# Patient Record
Sex: Female | Born: 1980 | Race: White | Hispanic: No | Marital: Married | Smoking: Current every day smoker
Health system: Southern US, Community
[De-identification: ages and names within clinical notes are randomized; demographics above are authoritative.]

## PROBLEM LIST (undated history)

## (undated) DIAGNOSIS — O139 Gestational [pregnancy-induced] hypertension without significant proteinuria, unspecified trimester: Secondary | ICD-10-CM

## (undated) DIAGNOSIS — K219 Gastro-esophageal reflux disease without esophagitis: Secondary | ICD-10-CM

## (undated) DIAGNOSIS — E669 Obesity, unspecified: Secondary | ICD-10-CM

## (undated) DIAGNOSIS — J189 Pneumonia, unspecified organism: Secondary | ICD-10-CM

## (undated) DIAGNOSIS — F329 Major depressive disorder, single episode, unspecified: Secondary | ICD-10-CM

## (undated) DIAGNOSIS — F32A Depression, unspecified: Secondary | ICD-10-CM

## (undated) DIAGNOSIS — I2699 Other pulmonary embolism without acute cor pulmonale: Secondary | ICD-10-CM

---
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
## 2003-01-02 ENCOUNTER — Inpatient Hospital Stay (HOSPITAL_COMMUNITY): Admission: AD | Admit: 2003-01-02 | Discharge: 2003-01-02 | Payer: Self-pay | Admitting: Obstetrics & Gynecology

## 2003-01-02 ENCOUNTER — Encounter: Payer: Self-pay | Admitting: Obstetrics & Gynecology

## 2003-01-31 ENCOUNTER — Ambulatory Visit (HOSPITAL_COMMUNITY): Admission: RE | Admit: 2003-01-31 | Discharge: 2003-01-31 | Payer: Self-pay | Admitting: Obstetrics & Gynecology

## 2003-01-31 ENCOUNTER — Encounter: Payer: Self-pay | Admitting: Obstetrics & Gynecology

## 2003-06-18 ENCOUNTER — Inpatient Hospital Stay (HOSPITAL_COMMUNITY): Admission: AD | Admit: 2003-06-18 | Discharge: 2003-06-21 | Payer: Self-pay | Admitting: Obstetrics

## 2003-06-19 ENCOUNTER — Encounter (INDEPENDENT_AMBULATORY_CARE_PROVIDER_SITE_OTHER): Payer: Self-pay | Admitting: Specialist

## 2009-08-17 ENCOUNTER — Emergency Department (HOSPITAL_COMMUNITY): Admission: EM | Admit: 2009-08-17 | Discharge: 2009-08-17 | Payer: Self-pay | Admitting: Emergency Medicine

## 2009-08-18 ENCOUNTER — Ambulatory Visit (HOSPITAL_COMMUNITY): Admission: RE | Admit: 2009-08-18 | Discharge: 2009-08-18 | Payer: Self-pay | Admitting: Emergency Medicine

## 2010-08-23 LAB — COMPREHENSIVE METABOLIC PANEL
ALT: 12 U/L (ref 0–35)
Alkaline Phosphatase: 65 U/L (ref 39–117)
CO2: 24 mEq/L (ref 19–32)
GFR calc non Af Amer: >60 mL/min (ref >60)
Glucose, Bld: 85 mg/dL (ref 70–99)
Potassium: 3.8 mEq/L (ref 3.5–5.1)
Sodium: 138 mEq/L (ref 135–145)

## 2010-08-23 LAB — POCT PREGNANCY, URINE: Preg Test, Ur: NEGATIVE

## 2010-08-23 LAB — URINALYSIS, ROUTINE W REFLEX MICROSCOPIC
Bilirubin Urine: NEGATIVE
Glucose, UA: NEGATIVE mg/dL
Hgb urine dipstick: NEGATIVE
Ketones, ur: NEGATIVE mg/dL
Nitrite: NEGATIVE
Protein, ur: NEGATIVE mg/dL
Specific Gravity, Urine: 1.029 (ref 1.005–1.030)
Urobilinogen, UA: 0.2 mg/dL (ref 0.0–1.0)
pH: 5 (ref 5.0–8.0)

## 2010-08-23 LAB — DIFFERENTIAL
Basophils Relative: 1 % (ref 0–1)
Eosinophils Absolute: 0.2 10*3/uL (ref 0.0–0.7)
Monocytes Relative: 4 % (ref 3–12)
Neutrophils Relative %: 73 % (ref 43–77)

## 2010-08-23 LAB — CBC
Hemoglobin: 13.3 g/dL (ref 12.0–15.0)
RBC: 4.47 MIL/uL (ref 3.87–5.11)

## 2010-08-23 LAB — URINE MICROSCOPIC-ADD ON

## 2010-10-15 NOTE — H&P (Signed)
NAME:  Samantha Bennett, Samantha Bennett                          ACCOUNT NO.:  [id number]   MEDICAL RECORD NO.:  [id number]                   PATIENT TYPE:  MAT   LOCATION:  [facility]                                 FACILITY:  [facility]   PHYSICIAN:  Roseanna Rainbow, M.D.         DATE OF BIRTH:  December 10, 1980   DATE OF ADMISSION:  06/18/2003  DATE OF DISCHARGE:                                HISTORY & PHYSICAL   CHIEF COMPLAINT:  The patient is a 30 year old gravida 5, para 0 with  estimated date of confinement June 18, 2003 confirmed by a 15-week  ultrasound with an intrauterine pregnancy at 40 weeks with elevated blood  pressures in the office.   HISTORY OF PRESENT ILLNESS:  As above.  The patient denies any complaints  consistent with severe preeclampsia.   PREGNANCY PROBLEMS AND RISKS:  History of bipolar disorder, obesity, and  tobacco use.   LABORATORY WORK:  Hemoglobin and hematocrit 13 and 37.4, platelets 204,000,  blood type A positive, Rh antibody negative, sickle cell trait negative, RPR  nonreactive, rubella immune, hepatitis B surface antigen negative, HIV  nonreactive, GBS negative on May 23, 2003, quad screen within normal  limits, 1-hour GCT 123, repeat RPR nonreactive, most recent ultrasound on  June 04, 2003 estimated fetal weight percentile 72nd percentile, normal  amniotic fluid index.   PAST OBSTETRICAL-GYNECOLOGICAL HISTORY:  She is status post four termination  of pregnancies, three were medical and one was a suction D&C.   PAST MEDICAL HISTORY:  See above.  Also remarkable for GERD.   FAMILY HISTORY:  Remarkable for COPD.   PAST SURGICAL HISTORY:  She denies.   SOCIAL HISTORY:  Single, employed as a Advertising account planner, she has history of  tobacco use, she denies any ethanol or substance abuse.   ALLERGIES:  No known drug allergies.   MEDICATIONS:  Prenatal vitamins.   PHYSICAL EXAMINATION:  VITAL SIGNS:  Temperature 97.6, pulse 88, blood  pressure 144/83.  Urine  remarkable for trace protein.  GENERAL:  Overweight; no apparent distress.  ABDOMEN:  Gravid, nontender, fetal heart tones 140s, cephalic presentation  by Leopold's.  STERILE VAGINAL EXAM:  Cervix is loose, 1, 50%, with the vertex at a -3  station.  The membranes are stripped.   ASSESSMENT:  Primigravida at term with likely mild pregnancy-induced  hypertension, gestational hypertension, borderline Fisher score.   PLAN:  Admission for induction of labor, we will begin with cervical  ripening with Cytotec, we will also obtain a preeclampsia lab profile.                                               Roseanna Rainbow, M.D.    Judee Clara  D:  06/18/2003  T:  06/18/2003  Job:  [id number]

## 2014-05-30 HISTORY — PX: WISDOM TOOTH EXTRACTION: SHX21

## 2015-05-05 ENCOUNTER — Emergency Department (HOSPITAL_BASED_OUTPATIENT_CLINIC_OR_DEPARTMENT_OTHER)
Admission: EM | Admit: 2015-05-05 | Discharge: 2015-05-06 | Disposition: A | Discharge status: Home / Self Care | Payer: Medicaid Other | Attending: Emergency Medicine | Admitting: Emergency Medicine

## 2015-05-05 ENCOUNTER — Encounter (HOSPITAL_BASED_OUTPATIENT_CLINIC_OR_DEPARTMENT_OTHER): Payer: Self-pay | Admitting: *Deleted

## 2015-05-05 ENCOUNTER — Emergency Department (HOSPITAL_BASED_OUTPATIENT_CLINIC_OR_DEPARTMENT_OTHER): Payer: Medicaid Other

## 2015-05-05 DIAGNOSIS — Y998 Other external cause status: Secondary | ICD-10-CM | POA: Diagnosis not present

## 2015-05-05 DIAGNOSIS — Y9371 Activity, boxing: Secondary | ICD-10-CM | POA: Insufficient documentation

## 2015-05-05 DIAGNOSIS — W500XXA Accidental hit or strike by another person, initial encounter: Secondary | ICD-10-CM | POA: Diagnosis not present

## 2015-05-05 DIAGNOSIS — F17210 Nicotine dependence, cigarettes, uncomplicated: Secondary | ICD-10-CM | POA: Insufficient documentation

## 2015-05-05 DIAGNOSIS — S62357A Nondisplaced fracture of shaft of fifth metacarpal bone, left hand, initial encounter for closed fracture: Secondary | ICD-10-CM | POA: Insufficient documentation

## 2015-05-05 DIAGNOSIS — Y92838 Other recreation area as the place of occurrence of the external cause: Secondary | ICD-10-CM | POA: Insufficient documentation

## 2015-05-05 DIAGNOSIS — S62318A Displaced fracture of base of other metacarpal bone, initial encounter for closed fracture: Secondary | ICD-10-CM

## 2015-05-05 DIAGNOSIS — S6992XA Unspecified injury of left wrist, hand and finger(s), initial encounter: Secondary | ICD-10-CM | POA: Diagnosis present

## 2015-05-05 MED ORDER — HYDROCODONE-ACETAMINOPHEN 5-325 MG PO TABS
1.0000 | ORAL_TABLET | Freq: Once | ORAL | Status: AC
  Administered 2015-05-05: 1 via ORAL
  Filled 2015-05-05: qty 1

## 2015-05-05 MED ORDER — HYDROCODONE-ACETAMINOPHEN 5-325 MG PO TABS: 1.0000 | ORAL_TABLET | Freq: Four times a day (QID) | ORAL | Status: DC | PRN

## 2015-05-05 NOTE — ED Provider Notes (Signed)
CSN: [id number]     Arrival date & time 05/05/15  2049 History  By signing my name below, I, Soijett Blue, attest that this documentation has been prepared under the direction and in the presence of Paula Libra, MD. Electronically Signed: Soijett Blue, ED Scribe. 05/05/2015. 10:57 PM.   Chief Complaint  Patient presents with  . Hand Injury      The history is provided by the patient. No language interpreter was used.    Samantha Bennett is a 34 y.o. female who presents to the Emergency Department complaining of left hand injury onset 1 week ago tomorrow. She notes that she was in boxing class sparring when she hit another person on the top of their head causing immediate pain in her left hand. She reports that her left hand pain has been constant and has not been getting better. It is moderate to severe at times, worse with movement or palpation. There is associated bruising and mild swelling but no deformity, numbness or functional deficit. She has not tried any medications for the relief of her symptoms. She denies other injury.  History reviewed. No pertinent past medical history. History reviewed. No pertinent past surgical history. No family history on file. Social History  Substance Use Topics  . Smoking status: Current Every Day Smoker -- 0.50 packs/day    Types: Cigarettes  . Smokeless tobacco: None  . Alcohol Use: No   OB History    No data available     Review of Systems  A complete 10 system review of systems was obtained and all systems are negative except as noted in the HPI and PMH.   Allergies  Review of patient's allergies indicates no known allergies.  Home Medications   Prior to Admission medications   Medication Sig Start Date End Date Taking? Authorizing Provider  HYDROcodone-acetaminophen (NORCO) 5-325 MG tablet Take 1-2 tablets by mouth every 6 (six) hours as needed (for pain). 05/05/15   Quinita Kostelecky, MD   BP 138/87 mmHg  Pulse 86  Temp(Src) 97.9 F  (36.6 C) (Oral)  Resp 18  Ht  (1.626 m)  Wt 230 lb (104.327 kg)  BMI 39.46 kg/m2  SpO2 99%   Physical Exam General: Well-developed, well-nourished female in no acute distress; appearance consistent with age of record HENT: normocephalic; atraumatic Eyes: pupils equal, round and reactive to light; extraocular muscles intact Neck: supple Heart: regular rate and rhythm Lungs: clear to auscultation bilaterally Abdomen: soft; nondistended; nontender; bowel sounds present Extremities: No deformity; full range of motion; pulses normal; ecchymosis, mild swelling, and tenderness over the left fifth metacarpal, left fifth finger neurovascularly intact with intact tendon function Neurologic: Awake, alert and oriented; motor function intact in all extremities and symmetric; no facial droop Skin: Warm and dry Psychiatric: Normal mood and affect   ED Course  Procedures (including critical care time) DIAGNOSTIC STUDIES: Oxygen Saturation is 99% on RA, nl by my interpretation.    COORDINATION OF CARE: 10:57 PM Discussed treatment plan with pt at bedside which includes left hand xray, left hand splint and pt agreed to plan.    MDM  Nursing notes and vitals signs, including pulse oximetry, reviewed.  Summary of this visit's results, reviewed by myself:  Imaging Studies: Dg Hand Complete Left  05/05/2015  CLINICAL DATA:  Boxing injury to left hand 1 week ago. Lateral left hand pain and swelling. Initial encounter. EXAM: LEFT HAND - COMPLETE 3+ VIEW COMPARISON:  None. FINDINGS: Nondisplaced fracture of the fifth  metacarpal shaft is seen. No other fractures identified. No evidence of dislocation. IMPRESSION: Nondisplaced fracture of fifth metacarpal shaft. Electronically Signed   By: Myles RosenthalJohn  Stahl M.D.   On: 05/05/2015 21:40     Final diagnoses:  Fracture of fifth metacarpal bone, closed, initial encounter   I personally performed the services described in this documentation, which was  scribed in my presence. The recorded information has been reviewed and is accurate.   Paula LibraJohn Bellatrix Devonshire, MD 05/05/15 [phone]

## 2015-05-05 NOTE — ED Notes (Signed)
Left hand injury in boxing class a week ago. Pain is not getting better with time.

## 2015-10-29 DIAGNOSIS — J189 Pneumonia, unspecified organism: Secondary | ICD-10-CM

## 2015-10-29 HISTORY — DX: Pneumonia, unspecified organism: J18.9

## 2015-12-02 ENCOUNTER — Observation Stay (HOSPITAL_BASED_OUTPATIENT_CLINIC_OR_DEPARTMENT_OTHER)
Admission: EM | Admit: 2015-12-02 | Discharge: 2015-12-04 | Disposition: A | Discharge status: Home / Self Care | Payer: Medicaid Other | Attending: Internal Medicine | Admitting: Internal Medicine

## 2015-12-02 ENCOUNTER — Emergency Department (HOSPITAL_BASED_OUTPATIENT_CLINIC_OR_DEPARTMENT_OTHER): Payer: Medicaid Other

## 2015-12-02 ENCOUNTER — Encounter (HOSPITAL_BASED_OUTPATIENT_CLINIC_OR_DEPARTMENT_OTHER): Payer: Self-pay | Admitting: *Deleted

## 2015-12-02 DIAGNOSIS — N390 Urinary tract infection, site not specified: Secondary | ICD-10-CM | POA: Diagnosis not present

## 2015-12-02 DIAGNOSIS — K219 Gastro-esophageal reflux disease without esophagitis: Secondary | ICD-10-CM | POA: Diagnosis not present

## 2015-12-02 DIAGNOSIS — D72829 Elevated white blood cell count, unspecified: Secondary | ICD-10-CM | POA: Diagnosis present

## 2015-12-02 DIAGNOSIS — F17210 Nicotine dependence, cigarettes, uncomplicated: Secondary | ICD-10-CM | POA: Insufficient documentation

## 2015-12-02 DIAGNOSIS — I2699 Other pulmonary embolism without acute cor pulmonale: Secondary | ICD-10-CM | POA: Diagnosis not present

## 2015-12-02 DIAGNOSIS — Z720 Tobacco use: Secondary | ICD-10-CM | POA: Diagnosis not present

## 2015-12-02 DIAGNOSIS — Z6841 Body Mass Index (BMI) 40.0 and over, adult: Secondary | ICD-10-CM | POA: Insufficient documentation

## 2015-12-02 DIAGNOSIS — F329 Major depressive disorder, single episode, unspecified: Secondary | ICD-10-CM | POA: Diagnosis not present

## 2015-12-02 HISTORY — DX: Depression, unspecified: F32.A

## 2015-12-02 HISTORY — DX: Obesity, unspecified: E66.9

## 2015-12-02 HISTORY — DX: Gastro-esophageal reflux disease without esophagitis: K21.9

## 2015-12-02 HISTORY — DX: Gestational (pregnancy-induced) hypertension without significant proteinuria, unspecified trimester: O13.9

## 2015-12-02 HISTORY — DX: Pneumonia, unspecified organism: J18.9

## 2015-12-02 HISTORY — DX: Other pulmonary embolism without acute cor pulmonale: I26.99

## 2015-12-02 HISTORY — DX: Major depressive disorder, single episode, unspecified: F32.9

## 2015-12-02 LAB — CBC WITH DIFFERENTIAL/PLATELET
BASOS PCT: 1 %
Basophils Absolute: 0.2 10*3/uL — ABNORMAL HIGH (ref 0.0–0.1)
EOS ABS: 0 10*3/uL (ref 0.0–0.7)
Eosinophils Relative: 0 %
HCT: 40.1 % (ref 36.0–46.0)
Hemoglobin: 13.6 g/dL (ref 12.0–15.0)
Lymphocytes Relative: 8 %
Lymphs Abs: 1.9 10*3/uL (ref 0.7–4.0)
MCH: 29.4 pg (ref 26.0–34.0)
MCHC: 33.9 g/dL (ref 30.0–36.0)
MCV: 86.6 fL (ref 78.0–100.0)
MONO ABS: 0.2 10*3/uL (ref 0.1–1.0)
Monocytes Relative: 1 %
NEUTROS ABS: 21.4 10*3/uL — AB (ref 1.7–7.7)
NEUTROS PCT: 90 %
PLATELETS: 306 10*3/uL (ref 150–400)
RBC: 4.63 MIL/uL (ref 3.87–5.11)
RDW: 13.5 % (ref 11.5–15.5)
WBC: 23.7 10*3/uL — ABNORMAL HIGH (ref 4.0–10.5)

## 2015-12-02 LAB — URINALYSIS, ROUTINE W REFLEX MICROSCOPIC
BILIRUBIN URINE: NEGATIVE
Glucose, UA: NEGATIVE mg/dL
Ketones, ur: NEGATIVE mg/dL
Nitrite: NEGATIVE
PROTEIN: 30 mg/dL — AB
Specific Gravity, Urine: 1.021 (ref 1.005–1.030)
pH: 7 (ref 5.0–8.0)

## 2015-12-02 LAB — BASIC METABOLIC PANEL
Anion gap: 9 (ref 5–15)
BUN: 15 mg/dL (ref 6–20)
CALCIUM: 8.9 mg/dL (ref 8.9–10.3)
CO2: 25 mmol/L (ref 22–32)
CREATININE: 0.86 mg/dL (ref 0.44–1.00)
Chloride: 103 mmol/L (ref 101–111)
Glucose, Bld: 97 mg/dL (ref 65–99)
Potassium: 4.3 mmol/L (ref 3.5–5.1)
Sodium: 137 mmol/L (ref 135–145)

## 2015-12-02 LAB — URINE MICROSCOPIC-ADD ON

## 2015-12-02 LAB — PREGNANCY, URINE: PREG TEST UR: NEGATIVE

## 2015-12-02 LAB — TROPONIN I: Troponin I: <0.03 ng/mL (ref <0.03)

## 2015-12-02 MED ORDER — ACETAMINOPHEN 325 MG PO TABS: 650.0000 mg | ORAL_TABLET | Freq: Four times a day (QID) | ORAL | Status: DC | PRN

## 2015-12-02 MED ORDER — HYDROCODONE-ACETAMINOPHEN 5-325 MG PO TABS
1.0000 | ORAL_TABLET | Freq: Four times a day (QID) | ORAL | Status: DC | PRN
  Administered 2015-12-02 – 2015-12-03 (×3): 2 via ORAL
  Filled 2015-12-02 (×3): qty 2

## 2015-12-02 MED ORDER — PANTOPRAZOLE SODIUM 40 MG PO TBEC
40.0000 mg | DELAYED_RELEASE_TABLET | Freq: Two times a day (BID) | ORAL | Status: DC
  Administered 2015-12-02 – 2015-12-03 (×3): 40 mg via ORAL
  Filled 2015-12-02 (×4): qty 1

## 2015-12-02 MED ORDER — ALBUTEROL SULFATE (2.5 MG/3ML) 0.083% IN NEBU: 2.5000 mg | INHALATION_SOLUTION | RESPIRATORY_TRACT | Status: DC | PRN

## 2015-12-02 MED ORDER — DEXTROSE 5 % IV SOLN
1.0000 g | INTRAVENOUS | Status: DC
  Administered 2015-12-02: 1 g via INTRAVENOUS
  Filled 2015-12-02 (×2): qty 10

## 2015-12-02 MED ORDER — SODIUM CHLORIDE 0.9 % IV BOLUS (SEPSIS)
1000.0000 mL | Freq: Once | INTRAVENOUS | Status: AC
Start: 2015-12-02 — End: 2015-12-02
  Administered 2015-12-02: 1000 mL via INTRAVENOUS

## 2015-12-02 MED ORDER — ACETAMINOPHEN 650 MG RE SUPP: 650.0000 mg | Freq: Four times a day (QID) | RECTAL | Status: DC | PRN

## 2015-12-02 MED ORDER — HEPARIN (PORCINE) IN NACL 100-0.45 UNIT/ML-% IJ SOLN
INTRAMUSCULAR | Status: AC
Start: 2015-12-02 — End: 2015-12-03
  Administered 2015-12-03: 1650 [IU]/h via INTRAVENOUS
  Filled 2015-12-02: qty 250

## 2015-12-02 MED ORDER — ENSURE ENLIVE PO LIQD
237.0000 mL | Freq: Two times a day (BID) | ORAL | Status: DC
Start: 2015-12-03 — End: 2015-12-04

## 2015-12-02 MED ORDER — IOPAMIDOL (ISOVUE-370) INJECTION 76%
100.0000 mL | Freq: Once | INTRAVENOUS | Status: AC | PRN
  Administered 2015-12-02: 100 mL via INTRAVENOUS

## 2015-12-02 MED ORDER — IPRATROPIUM BROMIDE 0.02 % IN SOLN
0.5000 mg | Freq: Four times a day (QID) | RESPIRATORY_TRACT | Status: DC
  Administered 2015-12-03: 0.5 mg via RESPIRATORY_TRACT
  Filled 2015-12-02: qty 2.5

## 2015-12-02 MED ORDER — SODIUM CHLORIDE 0.9% FLUSH
3.0000 mL | Freq: Two times a day (BID) | INTRAVENOUS | Status: DC
  Administered 2015-12-02 – 2015-12-03 (×3): 3 mL via INTRAVENOUS

## 2015-12-02 MED ORDER — METHYLPREDNISOLONE SODIUM SUCC 125 MG IJ SOLR
125.0000 mg | Freq: Once | INTRAMUSCULAR | Status: AC
  Administered 2015-12-02: 125 mg via INTRAVENOUS
  Filled 2015-12-02: qty 2

## 2015-12-02 MED ORDER — HEPARIN BOLUS VIA INFUSION
5000.0000 [IU] | Freq: Once | INTRAVENOUS | Status: AC
  Administered 2015-12-02: 5000 [IU] via INTRAVENOUS

## 2015-12-02 MED ORDER — HEPARIN (PORCINE) IN NACL 100-0.45 UNIT/ML-% IJ SOLN
1650.0000 [IU]/h | INTRAMUSCULAR | Status: DC
  Administered 2015-12-02: 1400 [IU]/h via INTRAVENOUS
  Administered 2015-12-03: 1650 [IU]/h via INTRAVENOUS
  Filled 2015-12-02: qty 250

## 2015-12-02 MED ORDER — ONDANSETRON HCL 4 MG/2ML IJ SOLN: 4.0000 mg | Freq: Four times a day (QID) | INTRAMUSCULAR | Status: DC | PRN

## 2015-12-02 MED ORDER — ONDANSETRON HCL 4 MG PO TABS
4.0000 mg | ORAL_TABLET | Freq: Four times a day (QID) | ORAL | Status: DC | PRN
Start: 2015-12-02 — End: 2015-12-04

## 2015-12-02 NOTE — ED Notes (Signed)
Attempt to call report x 2 , nurse still unavailable

## 2015-12-02 NOTE — ED Notes (Signed)
Pt was recently seen and treated for pneumonia.  She completed a course of antibiotics without improving.  Pt reports increased SOB, she is coughing up some blood, has had some night sweats and weight loss.

## 2015-12-02 NOTE — ED Notes (Signed)
Attempt to call report nurse not available 

## 2015-12-02 NOTE — ED Provider Notes (Signed)
CSN: [id number]     Arrival date & time 12/02/15  1639 History   First MD Initiated Contact with Patient 12/02/15 1647     Chief Complaint  Patient presents with  . Pneumonia   Pt is a 35 yo wf who said she was treated for pneumonia by her pcp with Levaquin and prednisone.  She finished her last dose 2 days ago.  She is not improving.  Pt also reports a 10 pound weight loss.  The said she woke up last night with right calf pain.  Pt continues to cough and is now coughing up some blood.  (Consider location/radiation/quality/duration/timing/severity/associated sxs/prior Treatment) Patient is a 35 y.o. female presenting with pneumonia. The history is provided by the patient.  Pneumonia This is a new problem. The current episode started more than 1 week ago. The problem has not changed since onset.Associated symptoms include chest pain and shortness of breath.    Past Medical History  Diagnosis Date  . Obesity    History reviewed. No pertinent past surgical history. No family history on file. Social History  Substance Use Topics  . Smoking status: Current Every Day Smoker -- 0.50 packs/day    Types: Cigarettes  . Smokeless tobacco: None  . Alcohol Use: No   OB History    No data available     Review of Systems  Respiratory: Positive for shortness of breath.   Cardiovascular: Positive for chest pain.  All other systems reviewed and are negative.     Allergies  Review of patient's allergies indicates no known allergies.  Home Medications   Prior to Admission medications   Medication Sig Start Date End Date Taking? Authorizing Provider  HYDROcodone-acetaminophen (NORCO) 5-325 MG tablet Take 1-2 tablets by mouth every 6 (six) hours as needed (for pain). 05/05/15   John Molpus, MD   BP 138/87 mmHg  Pulse 78  Temp(Src) 98.4 F (36.9 C) (Oral)  Resp 20  Wt 280 lb (127.007 kg)  SpO2 99%  LMP 11/23/2015 (Approximate) Physical Exam  Constitutional: She is oriented to person,  place, and time. She appears well-developed and well-nourished.  HENT:  Head: Normocephalic and atraumatic.  Right Ear: External ear normal.  Left Ear: External ear normal.  Nose: Nose normal.  Mouth/Throat: Oropharynx is clear and moist.  Eyes: Conjunctivae and EOM are normal. Pupils are equal, round, and reactive to light.  Neck: Normal range of motion. Neck supple.  Cardiovascular: Normal rate, regular rhythm, normal heart sounds and intact distal pulses.   Pulmonary/Chest: Effort normal and breath sounds normal.  Abdominal: Soft. Bowel sounds are normal.  Musculoskeletal: Normal range of motion.  Neurological: She is alert and oriented to person, place, and time.  Skin: Skin is warm and dry.  Psychiatric: She has a normal mood and affect. Her behavior is normal. Judgment and thought content normal.  Nursing note and vitals reviewed.   ED Course  Procedures (including critical care time) Labs Review Labs Reviewed  CBC WITH DIFFERENTIAL/PLATELET - Abnormal; Notable for the following:    WBC 23.7 (*)    Neutro Abs 21.4 (*)    Basophils Absolute 0.2 (*)    All other components within normal limits  URINALYSIS, ROUTINE W REFLEX MICROSCOPIC (NOT AT [facility]) - Abnormal; Notable for the following:    APPearance TURBID (*)    Hgb urine dipstick LARGE (*)    Protein, ur 30 (*)    Leukocytes, UA MODERATE (*)    All other components within normal limits  URINE MICROSCOPIC-ADD ON - Abnormal; Notable for the following:    Squamous Epithelial / LPF TOO NUMEROUS TO COUNT (*)    Bacteria, UA MANY (*)    All other components within normal limits  BASIC METABOLIC PANEL  PREGNANCY, URINE    Imaging Review Ct Angio Chest Pe W/cm &/or Wo Cm  12/02/2015  CLINICAL DATA:  Shortness of breath, left-sided chest and back pain. Recent pneumonia. 10 pound weight loss, night sweats, hemoptysis. EXAM: CT ANGIOGRAPHY CHEST WITH CONTRAST TECHNIQUE: Multidetector CT imaging of the chest was performed using  the standard protocol during bolus administration of intravenous contrast. Multiplanar CT image reconstructions and MIPs were obtained to evaluate the vascular anatomy. CONTRAST:  100 cc Isovue 370. COMPARISON:  None. FINDINGS: Cardiovascular: Filling defects are seen in the pulmonary arteries bilaterally, with the most proximal clot seen at the bifurcation of the left upper and left lower lobe pulmonary arteries. RV/LV ratio is 0.74. Heart size normal. Mediastinum/Nodes: No pathologically enlarged mediastinal, hilar or axillary lymph. Lungs/Pleura: There is rounded subpleural consolidation and ground-glass in the medial segment right middle lobe as well as lateral left lower lobe. No pleural fluid. Airway is unremarkable. Upper Abdomen: Visualized portions of the liver, gallbladder, adrenal glands, kidneys, spleen, pancreas, stomach and bowel are grossly unremarkable. Juxta diaphragmatic lymph node is sub cm in short axis size. Musculoskeletal: No worrisome lytic or sclerotic lesions. Review of the MIP images confirms the above findings. IMPRESSION: 1. Bilateral pulmonary emboli. RV/LV ratio of 0.74 does not support right heart strain. Critical Value/emergent results were called by telephone at the time of interpretation on 12/02/2015 at 6:24 pm to Dr. Jacalyn LefevreJULIE Chardae Mulkern , who verbally acknowledged these results. 2. Rounded areas of consolidation in the right middle and left lower lobes are likely due to infarcts. Pneumonia is not excluded. Electronically Signed   By: Leanna BattlesMelinda  Blietz M.D.   On: 12/02/2015 18:26   I have personally reviewed and evaluated these images and lab results as part of my medical decision-making.   EKG Interpretation None      MDM  Pt's birth control patch removed from right lower back.  Pt d/w Dr[facility] Ad[facility] who will accept pt for admission.  She requested heparin to be started, so we will do that.    Final diagnoses:  Bilateral pulmonary embolism [facility](HCC)  Pulmonary  infarct [facility](HCC)      Jacalyn LefevreJulie Jazir Newey, MD 12/02/15 [phone]

## 2015-12-02 NOTE — ED Notes (Signed)
Patient transported to CT 

## 2015-12-02 NOTE — ED Notes (Signed)
MD at bedside. 

## 2015-12-02 NOTE — Progress Notes (Signed)
ANTICOAGULATION CONSULT NOTE - Initial Consult  Pharmacy Consult for heparin Indication: pulmonary embolus  No Known Allergies  Patient Measurements: Height: 5' 4.17" (163 cm) Weight: 280 lb (127.007 kg) IBW/kg (Calculated) : 55.1 Heparin Dosing Weight: 86.3kg  Vital Signs: Temp: 98.4 F (36.9 C) (07/05 1650) Temp Source: Oral (07/05 1650) BP: 138/87 mmHg (07/05 1838) Pulse Rate: 78 (07/05 1838)  Labs:  Recent Labs  12/02/15 1650  HGB 13.6  HCT 40.1  PLT 306  CREATININE 0.86    Estimated Creatinine Clearance: 124.4 mL/min (by C-G formula based on Cr of 0.86).   Medical History: Past Medical History  Diagnosis Date  . Obesity     Medications:  Infusions:  . heparin      Assessment: 34 yof with recent pneumonia presented to the ED with increasing SOB. Found to have bilateral PE without evidence of right heart strain. Baseline H/H and platelets are WNL. She is not on anticoagulation PTA.  Goal of Therapy:  Heparin level 0.3-0.7 units/ml Monitor platelets by anticoagulation protocol: Yes   Plan:  - Heparin bolus 5000 units IV x 1 - Heparin gtt 1400 units/hr - Check a 6 hr heparin level - Daily heparin level and CBC - F/u plans for oral anticoagulation  Airanna Partin, Drake Leachachel Lynn 12/02/2015,7:24 PM

## 2015-12-02 NOTE — H&P (Addendum)
History and Physical    Samantha Bennett BJY:[id number] DOB: 03/20/1981 DOA: 12/02/2015  Referring MD/NP/PA: Dr. Adela Bennett PCP: Hildred PriestPAYNE, SUSAN M, NP  Patient coming from:from [facility]    Chief Complaint: Coughing up blood  HPI: Samantha Bennett is a 35 y.o. female with medical history significant of obesity, tobacco abuse, and GERD; who presents with complaints of coughing up blood and shortness of breath. Symptoms have been ongoing over the last 3 weeks. Initially started with a productive cough with some mucus and blood mixed in phlegm. 2 weeks ago she went to urgent care and was evaluated and thought to possibly have pneumonia. She was given a seven-day course of Levaquin, 10 day course of steroids, and albuterol inhaler and discharged home. She reports taking all all medications as prescribed. Symptoms did not improve and appeared to be getting worse. Associated symptoms included sharp back pain that would radiate to the front of her chest on the left and right side. Symptoms shortness of breath worsened with any exertional activity. Taking deep inspiratory breaths cause chest pain to be worsen. Also complained of frequency of urination, subjective fever, night sweats, and reports having severe muscle cramp in her right leg last night. about 3 weeks or so ago she went on a trip to [place] that took approximately 7 hours and she never got out during any of the stops. For birth control she utilizes the [employer] patch. Currently she also notes smoking half pack of cigarettes per day on average. She states that she would like to quit at this time as she previously had quit for 4 months, but restarted smoking about a month or 2 ago month. Lastly, patient notes that her mother has lupus and Sjogren's syndrome. Denies any loss of consciousness, diarrhea, nausea, or vomiting.  ED Course: On admission to the emergency department patient was seen to be afebrile, respirations up to 22, and all vital signs and  normal limits. Lab work was relatively unremarkable except for WBC count 23.7, peripheral smear showing large platelets. Pregnancy screen was negative. Urinalysis showed many bacteria moderate leukocytes, 6-30 RBCs, TNTC squamous epithelial cells, 6-30 WBCs. CT angiogram of the chest showed bilateral pulmonary emboli, no significant signs of right heart strain, and pneumonia could not be excluded..   Review of Systems: As per HPI otherwise 10 point review of systems negative.   Past Medical History  Diagnosis Date  . Obesity   . Gestational hypertension   . Bilateral pulmonary embolism (HCC) hospitalized 12/02/2015  . Pneumonia 10/2015  . GERD (gastroesophageal reflux disease)   . Depression     Past Surgical History  Procedure Laterality Date  . Wisdom tooth extraction  2016     reports that she has been smoking Cigarettes.  She has a 7 pack-year smoking history. She has never used smokeless tobacco. She reports that she drinks alcohol. She reports that she uses illicit drugs (Marijuana).  No Known Allergies  History reviewed. No pertinent family history.  Prior to Admission medications   Medication Sig Start Date End Date Taking? Authorizing Provider  HYDROcodone-acetaminophen (NORCO) 5-325 MG tablet Take 1-2 tablets by mouth every 6 (six) hours as needed (for pain). 05/05/15   Paula LibraJohn Molpus, MD    Physical Exam:   Constitutional:Young female in NAD, calm, comfortable Filed Vitals:   12/02/15 1650 12/02/15 1838 12/02/15 1900 12/02/15 2109  BP: 147/91 138/87 137/81 113/66  Pulse: 98 78 73 73  Temp: 98.4 F (36.9 C)   98  F (36.7 C)  TempSrc: Oral   Oral  Resp: Height: 5' 4.17" (1.63 Bennett)    (1.626 Bennett)  Weight: 127.007 kg (280 lb)   132.768 kg (292 lb 11.2 oz)  SpO2: 100% 99% 97% 98%   Eyes: PERRL, lids and conjunctivae normal ENMT: Mucous membranes are moist. Posterior pharynx clear of any exudate or lesions.Normal dentition.  Neck: normal, supple, no  masses, no thyromegaly Respiratory: clear to auscultation bilaterally, no wheezing, no crackles. Normal respiratory effort. No accessory muscle use.  Cardiovascular: Regular rate and rhythm, no murmurs / rubs / gallops. No extremity edema. 2+ pedal pulses. No carotid bruits.  Abdomen: no tenderness, no masses palpated. No hepatosplenomegaly. Bowel sounds positive.  Musculoskeletal: no clubbing / cyanosis. No joint deformity upper and lower extremities. Good ROM, no contractures. Normal muscle tone.  Skin: no rashes, lesions, ulcers. No induration Neurologic: CN 2-12 grossly intact. Sensation intact, DTR normal. Strength 5/5 in all 4.  Psychiatric: Normal judgment and insight. Alert and oriented x 3. Normal mood.     Labs on Admission: I have personally reviewed following labs and imaging studies  CBC:  Recent Labs Lab 12/02/15 1650  WBC 23.7*  NEUTROABS 21.4*  HGB 13.6  HCT 40.1  MCV 86.6  PLT 306   Basic Metabolic Panel:  Recent Labs Lab 12/02/15 1650  NA 137  K 4.3  CL 103  CO2 25  GLUCOSE 97  BUN 15  CREATININE 0.86  CALCIUM 8.9   GFR: Estimated Creatinine Clearance: 127.4 mL/min (by C-G formula based on Cr of 0.86). Liver Function Tests: No results for input(s): AST, ALT, ALKPHOS, BILITOT, PROT, ALBUMIN in the last 168 hours. No results for input(s): LIPASE, AMYLASE in the last 168 hours. No results for input(s): AMMONIA in the last 168 hours. Coagulation Profile: No results for input(s): INR, PROTIME in the last 168 hours. Cardiac Enzymes:  Recent Labs Lab 12/02/15 1955  TROPONINI <0.03   BNP (last 3 results) No results for input(s): PROBNP in the last 8760 hours. HbA1C: No results for input(s): HGBA1C in the last 72 hours. CBG: No results for input(s): GLUCAP in the last 168 hours. Lipid Profile: No results for input(s): CHOL, HDL, LDLCALC, TRIG, CHOLHDL, LDLDIRECT in the last 72 hours. Thyroid Function Tests: No results for input(s): TSH, T4TOTAL,  FREET4, T3FREE, THYROIDAB in the last 72 hours. Anemia Panel: No results for input(s): VITAMINB12, FOLATE, FERRITIN, TIBC, IRON, RETICCTPCT in the last 72 hours. Urine analysis:    Component Value Date/Time   COLORURINE YELLOW 12/02/2015 1720   APPEARANCEUR TURBID* 12/02/2015 1720   LABSPEC 1.021 12/02/2015 1720   PHURINE 7.0 12/02/2015 1720   GLUCOSEU NEGATIVE 12/02/2015 1720   HGBUR LARGE* 12/02/2015 1720   BILIRUBINUR NEGATIVE 12/02/2015 1720   KETONESUR NEGATIVE 12/02/2015 1720   PROTEINUR 30* 12/02/2015 1720   UROBILINOGEN 0.2 08/17/2009 1726   NITRITE NEGATIVE 12/02/2015 1720   LEUKOCYTESUR MODERATE* 12/02/2015 1720   Sepsis Labs: No results found for this or any previous visit (from the past 240 hour(s)).   Radiological Exams on Admission: Ct Angio Chest Pe W/cm &/or Wo Cm  12/02/2015  CLINICAL DATA:  Shortness of breath, left-sided chest and back pain. Recent pneumonia. 10 pound weight loss, night sweats, hemoptysis. EXAM: CT ANGIOGRAPHY CHEST WITH CONTRAST TECHNIQUE: Multidetector CT imaging of the chest was performed using the standard protocol during bolus administration of intravenous contrast. Multiplanar CT image reconstructions and MIPs were obtained to evaluate the vascular  anatomy. CONTRAST:  100 cc Isovue 370. COMPARISON:  None. FINDINGS: Cardiovascular: Filling defects are seen in the pulmonary arteries bilaterally, with the most proximal clot seen at the bifurcation of the left upper and left lower lobe pulmonary arteries. RV/LV ratio is 0.74. Heart size normal. Mediastinum/Nodes: No pathologically enlarged mediastinal, hilar or axillary lymph. Lungs/Pleura: There is rounded subpleural consolidation and ground-glass in the medial segment right middle lobe as well as lateral left lower lobe. No pleural fluid. Airway is unremarkable. Upper Abdomen: Visualized portions of the liver, gallbladder, adrenal glands, kidneys, spleen, pancreas, stomach and bowel are grossly  unremarkable. Juxta diaphragmatic lymph node is sub cm in short axis size. Musculoskeletal: No worrisome lytic or sclerotic lesions. Review of the MIP images confirms the above findings. IMPRESSION: 1. Bilateral pulmonary emboli. RV/LV ratio of 0.74 does not support right heart strain. Critical Value/emergent results were called by telephone at the time of interpretation on 12/02/2015 at 6:24 pm to Dr. Jacalyn LefevreJULIE HAVILAND , who verbally acknowledged these results. 2. Rounded areas of consolidation in the right middle and left lower lobes are likely due to infarcts. Pneumonia is not excluded. Electronically Signed   By: Leanna BattlesMelinda  Blietz Bennett.D.   On: 12/02/2015 18:26    EKG: Independently reviewed. Normal sinus rhythm  Assessment/Plan Pulmonary emboli with hemoptysis: Acute. Patient gives history of  pleuritic chest pain with hemoptysis and shortness of breath recent trip, birth control patch, and tobacco abuse as well as factors for pulmonary emboli. Symptoms appear to be provoked, but with patient's history of mother having lupus question possibility of lupus anticoagulant. Also make note of peripheral smear showing large platelets.  - Admit to telemetry bed - Heparin drip per pharmacy - Echocardiogram in a.Bennett. - check CBC/ aPTT/INR in am - Discontinue birth control patch, will need to discuss other contraceptive options given smoking history - check hypercoagulable panel - May want to curbside hematology in a.Bennett. regarding possible need for further workup  Leukocytosis: WBC 23.7. Suspect secondary to underlying urinary tract infection. - Repeat CBC in am   Urinary tract infection: Patient gives history of increased urinary frequency. UA appears to be positive, but could be a contaminant. - Empiric antibiotics of rocephin - Check urine culture off fresh specimen   Tobacco abuse - nicotine patch - Counseled patient on the need of cessation of tobacco use  DVT prophylaxis: Heparin per pharmacy Code  Status: Full Family Communication: Discussed overall plan with family including sister, husband, and husband in law present at bedside Disposition Plan: Discharge home once stable and able to start transitioning to oral anticoagulation Consults called: none  Admission status: Telemetry observation  Clydie Braunondell A Atreus Hasz MD [facility] Pager [phone]  If 7PM-7AM, please contact night-coverage [email or web address] Password [email or web address]  12/02/2015, 10:02 PM

## 2015-12-02 NOTE — Plan of Care (Signed)
35 yo W no past hx, hx of CAP on Levaquin, developed hemoptysis and leg pain. On birth control patch CTA found bilateral PE. Bilateral infarcts. No heart strain. Accepted to tele. Started on Heparin drip  Samantha Bennett 7:12 PM

## 2015-12-03 ENCOUNTER — Observation Stay (HOSPITAL_BASED_OUTPATIENT_CLINIC_OR_DEPARTMENT_OTHER): Payer: Medicaid Other

## 2015-12-03 DIAGNOSIS — I2699 Other pulmonary embolism without acute cor pulmonale: Principal | ICD-10-CM

## 2015-12-03 DIAGNOSIS — N3000 Acute cystitis without hematuria: Secondary | ICD-10-CM | POA: Diagnosis not present

## 2015-12-03 DIAGNOSIS — N390 Urinary tract infection, site not specified: Secondary | ICD-10-CM | POA: Diagnosis present

## 2015-12-03 DIAGNOSIS — D72829 Elevated white blood cell count, unspecified: Secondary | ICD-10-CM

## 2015-12-03 DIAGNOSIS — Z720 Tobacco use: Secondary | ICD-10-CM | POA: Diagnosis present

## 2015-12-03 LAB — BASIC METABOLIC PANEL
Anion gap: 8 (ref 5–15)
BUN: 16 mg/dL (ref 6–20)
CO2: 25 mmol/L (ref 22–32)
Calcium: 8.8 mg/dL — ABNORMAL LOW (ref 8.9–10.3)
Chloride: 105 mmol/L (ref 101–111)
Creatinine, Ser: 0.83 mg/dL (ref 0.44–1.00)
GFR calc Af Amer: >60 mL/min (ref >60)
GLUCOSE: 169 mg/dL — AB (ref 65–99)
POTASSIUM: 4.6 mmol/L (ref 3.5–5.1)
Sodium: 138 mmol/L (ref 135–145)

## 2015-12-03 LAB — ECHOCARDIOGRAM COMPLETE
Height: 64 in
Weight: 4683.2 oz

## 2015-12-03 LAB — ANTITHROMBIN III: ANTITHROMB III FUNC: 110 % (ref 75–120)

## 2015-12-03 LAB — CBC
HCT: 38.6 % (ref 36.0–46.0)
Hemoglobin: 12.5 g/dL (ref 12.0–15.0)
MCH: 28.7 pg (ref 26.0–34.0)
MCHC: 32.4 g/dL (ref 30.0–36.0)
MCV: 88.7 fL (ref 78.0–100.0)
PLATELETS: 294 10*3/uL (ref 150–400)
RBC: 4.35 MIL/uL (ref 3.87–5.11)
RDW: 13.4 % (ref 11.5–15.5)
WBC: 20.1 10*3/uL — ABNORMAL HIGH (ref 4.0–10.5)

## 2015-12-03 LAB — PROTIME-INR
INR: 1.18 (ref 0.00–1.49)
PROTHROMBIN TIME: 15.2 s (ref 11.6–15.2)

## 2015-12-03 LAB — APTT: APTT: 81 s — AB (ref 24–37)

## 2015-12-03 LAB — HEPARIN LEVEL (UNFRACTIONATED): Heparin Unfractionated: 0.14 IU/mL — ABNORMAL LOW (ref 0.30–0.70)

## 2015-12-03 MED ORDER — NICOTINE 21 MG/24HR TD PT24
21.0000 mg | MEDICATED_PATCH | Freq: Every day | TRANSDERMAL | Status: DC
  Filled 2015-12-03 (×2): qty 1

## 2015-12-03 MED ORDER — RIVAROXABAN 20 MG PO TABS: 20.0000 mg | ORAL_TABLET | Freq: Every day | ORAL | Status: DC

## 2015-12-03 MED ORDER — HEPARIN BOLUS VIA INFUSION
4000.0000 [IU] | Freq: Once | INTRAVENOUS | Status: AC
  Administered 2015-12-03: 4000 [IU] via INTRAVENOUS
  Filled 2015-12-03: qty 4000

## 2015-12-03 MED ORDER — CIPROFLOXACIN HCL 500 MG PO TABS
500.0000 mg | ORAL_TABLET | Freq: Two times a day (BID) | ORAL | Status: DC
  Administered 2015-12-03 – 2015-12-04 (×3): 500 mg via ORAL
  Filled 2015-12-03 (×3): qty 1

## 2015-12-03 MED ORDER — RIVAROXABAN 15 MG PO TABS
15.0000 mg | ORAL_TABLET | Freq: Two times a day (BID) | ORAL | Status: DC
  Administered 2015-12-03 – 2015-12-04 (×3): 15 mg via ORAL
  Filled 2015-12-03 (×3): qty 1

## 2015-12-03 NOTE — Discharge Instructions (Signed)
Information on my medicine - XARELTO (rivaroxaban)  This medication education was reviewed with me or my healthcare representative as part of my discharge preparation.  The pharmacist that spoke with me during my hospital stay was:  Almon HerculesBaird, Albert Devaul P, [facility]  WHY WAS Samantha HurlXARELTO PRESCRIBED FOR YOU? Xarelto was prescribed to treat blood clots that may have been found in the veins of your legs (deep vein thrombosis) or in your lungs (pulmonary embolism) and to reduce the risk of them occurring again.  What do you need to know about Xarelto? The starting dose is one 15 mg tablet taken TWICE daily with food for the FIRST 21 DAYS then on (enter date)  12/24/15  the dose is changed to one 20 mg tablet taken ONCE A DAY with your evening meal.  DO NOT stop taking Xarelto without talking to the health care provider who prescribed the medication.  Refill your prescription for 20 mg tablets before you run out.  After discharge, you should have regular check-up appointments with your healthcare provider that is prescribing your Xarelto.  In the future your dose may need to be changed if your kidney function changes by a significant amount.  What do you do if you miss a dose? If you are taking Xarelto TWICE DAILY and you miss a dose, take it as soon as you remember. You may take two 15 mg tablets (total 30 mg) at the same time then resume your regularly scheduled 15 mg twice daily the next day.  If you are taking Xarelto ONCE DAILY and you miss a dose, take it as soon as you remember on the same day then continue your regularly scheduled once daily regimen the next day. Do not take two doses of Xarelto at the same time.   Important Safety Information Xarelto is a blood thinner medicine that can cause bleeding. You should call your healthcare provider right away if you experience any of the following: ? Bleeding from an injury or your nose that does not stop. ? Unusual colored urine (red or dark brown) or  unusual colored stools (red or black). ? Unusual bruising for unknown reasons. ? A serious fall or if you hit your head (even if there is no bleeding).  Some medicines may interact with Xarelto and might increase your risk of bleeding while on Xarelto. To help avoid this, consult your healthcare provider or pharmacist prior to using any new prescription or non-prescription medications, including herbals, vitamins, non-steroidal anti-inflammatory drugs (NSAIDs) and supplements.  This website has more information on Xarelto: [email or web address].[email or web address].

## 2015-12-03 NOTE — Progress Notes (Signed)
*  PRELIMINARY RESULTS* Echocardiogram 2D Echocardiogram has been performed.  Samantha Bennett, Samantha Bennett 12/03/2015, 10:15 AM

## 2015-12-03 NOTE — Progress Notes (Signed)
PROGRESS NOTE    Samantha Bennett  FAO:[id number] DOB: Aug 02, 1980 DOA: 12/02/2015 PCP: Hildred Priest, NP   Brief Narrative:  Samantha Bennett is a pleasant 35 year old female with a past medical history of morbid obesity, tobacco abuse, who had been on contraceptive therapy, had taken a 7 hour car trip to [place] about 3 weeks ago, presented to the emergency department with complaints of shortness of breath and pleuritic type chest pain. Workup revealed bilateral pulmonary emboli. Samantha Bennett was placed on IV heparin admitted to the medicine service.   Assessment & Plan:   Principal Problem:   Pulmonary emboli (HCC) Active Problems:   Leukocytosis   Tobacco abuse   Infection of urinary tract  1.  Bilateral pulmonary emboli. -Samantha Bennett possesses many risk factors for development of thromboembolism including history of morbid obesity, contraceptive therapy, tobacco abuse, recent car ride to [place]. -Samantha Bennett presented with complaints of chest pain and shortness of breath and further workup with CT scan revealed the presence of bilateral pulmonary emboli. -Samantha Bennett is hemodynamically stable -IV heparin will be discontinued with pharmacy consultation for Xarelto [facility] monitor Samantha Bennett for the next 24 hours anticipate discharge in a.m. if Samantha Bennett remains hemodynamically stable.  2.  Urinary tract infection -Discontinue IV ceftriaxone, transition to oral Cipro 500 mg twice a day  3.  Leukocytosis. -A.m. lab work showing a white count of 20,000, I suspect related to pulmonary embolism.   DVT prophylaxis: Samantha Bennett is fully anticoagulated Code Status: Full code Family Communication: I spoke to Samantha Bennett fianc at bedside Disposition Plan: Anticipate discharge in the next 24 hours  Consultants:   Pharmacy  Antimicrobials:   Cipro 500 mg by mouth twice a day   Subjective: Samantha Bennett reports feeling little better this morning, states improvement to Samantha Bennett chest pain and shortness of breath  Objective: Filed Vitals:   12/02/15  1900 12/02/15 2109 12/03/15 0114 12/03/15 0552  BP: 137/81 113/66  135/80  Pulse: 73 73 72 63  Temp:  98 F (36.7 C)  97.9 F (36.6 C)  TempSrc:  Oral  Oral  Resp: Height:   (1.626 m)    Weight:  132.768 kg (292 lb 11.2 oz)    SpO2: 97% 98%  96%   No intake or output data in the 24 hours ending 12/03/15 1142 Filed Weights   12/02/15 1650 12/02/15 2109  Weight: 127.007 kg (280 lb) 132.768 kg (292 lb 11.2 oz)    Examination:  General exam: Appears calm and comfortable  Respiratory system: Clear to auscultation. Respiratory effort normal. Cardiovascular system: S1 & S2 heard, RRR. No JVD, murmurs, rubs, gallops or clicks. No pedal edema. Gastrointestinal system: Abdomen is nondistended, soft and nontender. No organomegaly or masses felt. Normal bowel sounds heard. Central nervous system: Alert and oriented. No focal neurological deficits. Extremities: Symmetric 5 x 5 power. Skin: No rashes, lesions or ulcers Psychiatry: Judgement and insight appear normal. Mood & affect appropriate.     Data Reviewed: I have personally reviewed following labs and imaging studies  CBC:  Recent Labs Lab 12/02/15 1650 12/03/15 0447  WBC 23.7* 20.1*  NEUTROABS 21.4*  --   HGB 13.6 12.5  HCT 40.1 38.6  MCV 86.6 88.7  PLT 306 294   Basic Metabolic Panel:  Recent Labs Lab 12/02/15 1650 12/03/15 0447  NA 137 138  K 4.3 4.6  CL 103 105  CO2 25 25  GLUCOSE 97 169*  BUN 15 16  CREATININE 0.86 0.83  CALCIUM 8.9 8.8*   GFR: Estimated Creatinine Clearance: 132 mL/min (by C-G formula based on Cr of 0.83). Liver Function Tests: No results for input(s): AST, ALT, ALKPHOS, BILITOT, PROT, ALBUMIN in the last 168 hours. No results for input(s): LIPASE, AMYLASE in the last 168 hours. No results for input(s): AMMONIA in the last 168 hours. Coagulation Profile:  Recent Labs Lab 12/03/15 0447  INR 1.18   Cardiac Enzymes:  Recent Labs Lab 12/02/15 1955  TROPONINI  <0.03   BNP (last 3 results) No results for input(s): PROBNP in the last 8760 hours. HbA1C: No results for input(s): HGBA1C in the last 72 hours. CBG: No results for input(s): GLUCAP in the last 168 hours. Lipid Profile: No results for input(s): CHOL, HDL, LDLCALC, TRIG, CHOLHDL, LDLDIRECT in the last 72 hours. Thyroid Function Tests: No results for input(s): TSH, T4TOTAL, FREET4, T3FREE, THYROIDAB in the last 72 hours. Anemia Panel: No results for input(s): VITAMINB12, FOLATE, FERRITIN, TIBC, IRON, RETICCTPCT in the last 72 hours. Sepsis Labs: No results for input(s): PROCALCITON, LATICACIDVEN in the last 168 hours.  No results found for this or any previous visit (from the past 240 hour(s)).       Radiology Studies: Ct Angio Chest Pe W/cm &/or Wo Cm  12/02/2015  CLINICAL DATA:  Shortness of breath, left-sided chest and back pain. Recent pneumonia. 10 pound weight loss, night sweats, hemoptysis. EXAM: CT ANGIOGRAPHY CHEST WITH CONTRAST TECHNIQUE: Multidetector CT imaging of the chest was performed using the standard protocol during bolus administration of intravenous contrast. Multiplanar CT image reconstructions and MIPs were obtained to evaluate the vascular anatomy. CONTRAST:  100 cc Isovue 370. COMPARISON:  None. FINDINGS: Cardiovascular: Filling defects are seen in the pulmonary arteries bilaterally, with the most proximal clot seen at the bifurcation of the left upper and left lower lobe pulmonary arteries. RV/LV ratio is 0.74. Heart size normal. Mediastinum/Nodes: No pathologically enlarged mediastinal, hilar or axillary lymph. Lungs/Pleura: There is rounded subpleural consolidation and ground-glass in the medial segment right middle lobe as well as lateral left lower lobe. No pleural fluid. Airway is unremarkable. Upper Abdomen: Visualized portions of the liver, gallbladder, adrenal glands, kidneys, spleen, pancreas, stomach and bowel are grossly unremarkable. Juxta diaphragmatic  lymph node is sub cm in short axis size. Musculoskeletal: No worrisome lytic or sclerotic lesions. Review of the MIP images confirms the above findings. IMPRESSION: 1. Bilateral pulmonary emboli. RV/LV ratio of 0.74 does not support right heart strain. Critical Value/emergent results were called by telephone at the time of interpretation on 12/02/2015 at 6:24 pm to Dr. Jacalyn LefevreJULIE HAVILAND , who verbally acknowledged these results. 2. Rounded areas of consolidation in the right middle and left lower lobes are likely due to infarcts. Pneumonia is not excluded. Electronically Signed   By: Leanna BattlesMelinda  Blietz M.D.   On: 12/02/2015 18:26        Scheduled Meds: . ciprofloxacin  500 mg Oral BID  . feeding supplement (ENSURE ENLIVE)  237 mL Oral BID BM  . nicotine  21 mg Transdermal Daily  . pantoprazole  40 mg Oral BID  . rivaroxaban  15 mg Oral BID WC   Followed by  . [START ON 12/24/2015] rivaroxaban  20 mg Oral Q supper  . sodium chloride flush  3 mL Intravenous Q12H   Continuous Infusions:    LOS: 1 day    Time spent: 25 min    Jeralyn BennettZAMORA, Mikah Poss, MD [facility] Pager [phone]  If 7PM-7AM, please contact night-coverage [email or web address] Password [email or web address]  12/03/2015, 11:42 AM

## 2015-12-03 NOTE — Progress Notes (Signed)
Nutrition Brief Note  Patient identified on the Malnutrition Screening Tool (MST) Report.  Wt Readings from Last 15 Encounters:  12/02/15 292 lb 11.2 oz (132.768 kg)  05/05/15 230 lb (104.327 kg)    Body mass index is 50.22 kg/(m^2). Patient meets criteria for Obesity Class III based on current BMI.   Current diet order is Regular. Labs and medications reviewed.   No nutrition interventions warranted at this time. If nutrition issues arise, please consult RD.   Maureen ChattersKatie Brinley Rosete, RD, LDN Pager #: [phone] After-Hours Pager #: [phone]

## 2015-12-03 NOTE — Progress Notes (Signed)
ANTICOAGULATION CONSULT NOTE - Follow Up Consult  Pharmacy Consult for Heparin  Indication: pulmonary embolus  No Known Allergies  Patient Measurements: Height: 5\' 4"  (162.6 cm) Weight: 292 lb 11.2 oz (132.768 kg) IBW/kg (Calculated) : 54.7  Vital Signs: Temp: 98 F (36.7 C) (07/05 2109) Temp Source: Oral (07/05 2109) BP: 113/66 mmHg (07/05 2109) Pulse Rate: 72 (07/06 0114)  Labs:  Recent Labs  12/02/15 1650 12/02/15 1955 12/03/15 0140  HGB 13.6  --   --   HCT 40.1  --   --   PLT 306  --   --   HEPARINUNFRC  --   --  0.14*  CREATININE 0.86  --   --   TROPONINI  --  <0.03  --     Estimated Creatinine Clearance: 127.4 mL/min (by C-G formula based on Cr of 0.86).    Assessment: 35 y/o F with new onset PE, initial heparin level is sub-therapeutic, no issues per RN.   Goal of Therapy:  Heparin level 0.3-0.7 units/ml Monitor platelets by anticoagulation protocol: Yes    Plan:  -Heparin 4000 units BOLUS -Increase heparin drip to 1650 units/hr -1000 HL -F/U plans for oral anti-coagulation  Samantha Bennett, Samantha Bennett 12/03/2015,2:26 AM

## 2015-12-03 NOTE — Progress Notes (Signed)
ANTICOAGULATION CONSULT NOTE - Follow Up Consult  Pharmacy Consult for Heparin  Indication: pulmonary embolus  No Known Allergies  Patient Measurements: Height: 5\' 4"  (162.6 cm) Weight: 292 lb 11.2 oz (132.768 kg) IBW/kg (Calculated) : 54.7  Vital Signs: Temp: 97.9 F (36.6 C) (07/06 0552) Temp Source: Oral (07/06 0552) BP: 135/80 mmHg (07/06 0552) Pulse Rate: 63 (07/06 0552)  Labs:  Recent Labs  12/02/15 1650 12/02/15 1955 12/03/15 0140 12/03/15 0447  HGB 13.6  --   --  12.5  HCT 40.1  --   --  38.6  PLT 306  --   --  294  APTT  --   --   --  81*  LABPROT  --   --   --  15.2  INR  --   --   --  1.18  HEPARINUNFRC  --   --  0.14*  --   CREATININE 0.86  --   --  0.83  TROPONINI  --  <0.03  --   --     Estimated Creatinine Clearance: 132 mL/min (by C-G formula based on Cr of 0.83).   Assessment: 35 y/o F with new onset bilateral PE, no RHS. Now transitioning from heparin IV to Xarelto this AM. CBC wnl, no bleed documented. No AC pta.  Goal of Therapy:  VTE treatment  Monitor platelets by anticoagulation protocol: Yes    Plan:  -Turn off heparin drip when 1st dose of Xarelto given -Xarelto 15mg  bid x 21 days; then 20mg  Qsupper -Monitor CBC, s/sx bleeding  Babs BertinHaley Dalary Hollar, PharmD, BCPS Clinical Pharmacist Pager [phone] 12/03/2015 8:25 AM

## 2015-12-04 DIAGNOSIS — I2699 Other pulmonary embolism without acute cor pulmonale: Secondary | ICD-10-CM | POA: Diagnosis not present

## 2015-12-04 DIAGNOSIS — D72829 Elevated white blood cell count, unspecified: Secondary | ICD-10-CM | POA: Diagnosis not present

## 2015-12-04 LAB — BASIC METABOLIC PANEL
Anion gap: 6 (ref 5–15)
BUN: 17 mg/dL (ref 6–20)
CO2: 25 mmol/L (ref 22–32)
Calcium: 8 mg/dL — ABNORMAL LOW (ref 8.9–10.3)
Chloride: 108 mmol/L (ref 101–111)
Creatinine, Ser: 0.91 mg/dL (ref 0.44–1.00)
GFR calc Af Amer: >60 mL/min (ref >60)
GLUCOSE: 105 mg/dL — AB (ref 65–99)
POTASSIUM: 3.6 mmol/L (ref 3.5–5.1)
Sodium: 139 mmol/L (ref 135–145)

## 2015-12-04 LAB — URINE CULTURE: Culture: NO GROWTH

## 2015-12-04 LAB — CBC
HEMATOCRIT: 36.8 % (ref 36.0–46.0)
Hemoglobin: 12 g/dL (ref 12.0–15.0)
MCH: 28.7 pg (ref 26.0–34.0)
MCHC: 32.6 g/dL (ref 30.0–36.0)
MCV: 88 fL (ref 78.0–100.0)
Platelets: 256 10*3/uL (ref 150–400)
RBC: 4.18 MIL/uL (ref 3.87–5.11)
RDW: 13.6 % (ref 11.5–15.5)
WBC: 15.1 10*3/uL — ABNORMAL HIGH (ref 4.0–10.5)

## 2015-12-04 LAB — PROTEIN C ACTIVITY: Protein C Activity: 197 % — ABNORMAL HIGH (ref 73–180)

## 2015-12-04 LAB — LUPUS ANTICOAGULANT PANEL
DRVVT: 43.8 s (ref 0.0–47.0)
PTT Lupus Anticoagulant: 42.7 s (ref 0.0–51.9)

## 2015-12-04 LAB — BETA-2-GLYCOPROTEIN I ABS, IGG/M/A
Beta-2 Glyco I IgG: <9 GPI IgG units (ref 0–20)
Beta-2-Glycoprotein I IgM: <9 GPI IgM units (ref 0–32)

## 2015-12-04 LAB — HOMOCYSTEINE: HOMOCYSTEINE-NORM: 6.4 umol/L (ref 0.0–15.0)

## 2015-12-04 LAB — CARDIOLIPIN ANTIBODIES, IGG, IGM, IGA
Anticardiolipin IgG: <9 GPL U/mL (ref 0–14)
Anticardiolipin IgM: <9 MPL U/mL (ref 0–12)

## 2015-12-04 LAB — PROTEIN S ACTIVITY: PROTEIN S ACTIVITY: 91 % (ref 63–140)

## 2015-12-04 LAB — PROTEIN C, TOTAL: PROTEIN C, TOTAL: 132 % (ref 60–150)

## 2015-12-04 LAB — PROTEIN S, TOTAL: Protein S Ag, Total: 95 % (ref 60–150)

## 2015-12-04 MED ORDER — TRAMADOL HCL 50 MG PO TABS: 50.0000 mg | ORAL_TABLET | Freq: Four times a day (QID) | ORAL | Status: DC | PRN

## 2015-12-04 MED ORDER — RIVAROXABAN (XARELTO) VTE STARTER PACK (15 & 20 MG): ORAL_TABLET | ORAL | Status: DC

## 2015-12-04 MED ORDER — CIPROFLOXACIN HCL 500 MG PO TABS: 500.0000 mg | ORAL_TABLET | Freq: Two times a day (BID) | ORAL | Status: DC

## 2015-12-04 NOTE — Progress Notes (Signed)
Pt has been discharged home with significant other. CCMD was notified and telemetry box was removed. IV was removed with no complications. Pt received discharge instructions and all questions were answered. Pt received paper prescriptions and was instructed to get them filled at her pharmacy; pt verbalized understanding. Pt left the unit via wheelchair and was [facility] by a [facility] and pt's significant other. Pt was in no distress at time of discharge.   Berdine DanceLauren Moffitt BSN, RN

## 2015-12-04 NOTE — Discharge Summary (Signed)
Physician Discharge Summary  Samantha Bennett ZOX:[id number] DOB: [birth year] DOA: 12/02/2015  PCP: Hildred PriestPAYNE, SUSAN M, NP  Admit date: 12/02/2015 Discharge date: 12/04/2015  Time spent: 35 minutes  Recommendations for Outpatient Follow-up:  1. Samantha Bennett was diagnosed with PE, discharged on Xarelto   Discharge Diagnoses:  Principal Problem:   Pulmonary emboli [facility](HCC) Active Problems:   Leukocytosis   Tobacco abuse   Infection of urinary tract   Discharge Condition: Stable  Diet recommendation: Regular  Filed Weights   12/02/15 1650 12/02/15 2109  Weight: 127.007 kg (280 lb) 132.768 kg (292 lb 11.2 oz)    History of present illness:  Samantha Bennett is a 35 y.o. female with medical history significant of obesity, tobacco abuse, and GERD; who presents with complaints of coughing up blood and shortness of breath. Symptoms have been ongoing over the last 3 weeks. Initially started with a productive cough with some mucus and blood mixed in phlegm. 2 weeks ago she went to urgent care and was evaluated and thought to possibly have pneumonia. She was given a seven-day course of Levaquin, 10 day course of steroids, and albuterol inhaler and discharged home. She reports taking all all medications as prescribed. Symptoms did not improve and appeared to be getting worse. Associated symptoms included sharp back pain that would radiate to the front of her chest on the left and right side. Symptoms shortness of breath worsened with any exertional activity. Taking deep inspiratory breaths cause chest pain to be worsen. Also complained of frequency of urination, subjective fever, night sweats, and reports having severe muscle cramp in her right leg last night. about 3 weeks or so ago she went on a trip to [place] that took approximately 7 hours and she never got out during any of the stops. For birth control she utilizes the [employer] patch. Currently she also notes smoking half pack of cigarettes per day on average. She  states that she would like to quit at this time as she previously had quit for 4 months, but restarted smoking about a month or 2 ago month. Lastly, patient notes that her mother has lupus and Sjogren's syndrome. Denies any loss of consciousness, diarrhea, nausea, or vomiting.  Hospital Course:  Samantha Bennett is a pleasant 35 year old female with a past medical history of morbid obesity, tobacco abuse, who had been on contraceptive therapy, had taken a 7 hour car trip to [place] about 3 weeks ago, presented to the emergency department with complaints of shortness of breath and pleuritic type chest pain. Workup revealed bilateral pulmonary emboli. She was placed on IV heparin admitted to the medicine service.   1. Bilateral pulmonary emboli. -Mrs. Samantha Bennett possesses many risk factors for development of thromboembolism including history of morbid obesity, contraceptive therapy, tobacco abuse, recent car ride to [place]. -She presented with complaints of chest pain and shortness of breath and further workup with CT scan revealed the presence of bilateral pulmonary emboli. -She remained hemodynamically stable during this hospitalization -IV heparin was changed to Xarelto with pharmacy consultation for Xarelto.  2. Urinary tract infection -Discontinue IV ceftriaxone, transition to oral Cipro 500 mg twice a day  3. Leukocytosis. -A.m. lab work showing a white count of 20,000, I suspect related to pulmonary embolism. -Labs on 12/04/2015 showing downward trend in white count to 15.1   Consultations:  Pharmacy  Discharge Exam: Filed Vitals:   12/04/15 0545 12/04/15 0813  BP: 133/68   Pulse: 77 69  Temp: 98 F (36.7 C)  Resp: 20     General exam: Appears calm and comfortable  Respiratory system: Clear to auscultation. Respiratory effort normal. Cardiovascular system: S1 & S2 heard, RRR. No JVD, murmurs, rubs, gallops or clicks. No pedal edema. Gastrointestinal system: Abdomen is nondistended,  soft and nontender. No organomegaly or masses felt. Normal bowel sounds heard. Central nervous system: Alert and oriented. No focal neurological deficits. Extremities: Symmetric 5 x 5 power. Skin: No rashes, lesions or ulcers Psychiatry: Judgement and insight appear normal. Mood & affect appropriate.   Discharge Instructions   Discharge Instructions    Call MD for:  difficulty breathing, headache or visual disturbances    Complete by:  As directed      Call MD for:  extreme fatigue    Complete by:  As directed      Call MD for:  hives    Complete by:  As directed      Call MD for:  persistant dizziness or light-headedness    Complete by:  As directed      Call MD for:  persistant nausea and vomiting    Complete by:  As directed      Call MD for:  redness, tenderness, or signs of infection (pain, swelling, redness, odor or green/yellow discharge around incision site)    Complete by:  As directed      Call MD for:  severe uncontrolled pain    Complete by:  As directed      Call MD for:  temperature >100.4    Complete by:  As directed      Call MD for:    Complete by:  As directed      Diet - low sodium heart healthy    Complete by:  As directed      Increase activity slowly    Complete by:  As directed           Current Discharge Medication List    START taking these medications   Details  ciprofloxacin (CIPRO) 500 MG tablet Take 1 tablet (500 mg total) by mouth 2 (two) times daily. Qty: 6 tablet, Refills: 0    Rivaroxaban (XARELTO STARTER PACK) 15 & 20 MG TBPK Take as directed on package: Start with one  tablet by mouth twice a day with food. On Day 22, switch to one  tablet once a day with food. Qty: 51 each, Refills: 1    traMADol (ULTRAM) 50 MG tablet Take 1 tablet (50 mg total) by mouth every 6 (six) hours as needed. Qty: 12 tablet, Refills: 0      CONTINUE these medications which have NOT CHANGED   Details  PROAIR HFA 108 (90 Base) MCG/ACT inhaler INL 2  PFS PO Q 4 TO 6 H PRN FOR SOB / WHZ Refills: 12      STOP taking these medications     XULANE 150-35 MCG/24HR transdermal patch      HYDROcodone-acetaminophen (NORCO) 5-325 MG tablet        No Known Allergies Follow-up Information    Follow up with PAYNE, SUSAN M, NP In 1 week.   Specialty:  Internal Medicine   Contact information:   [address] [place] [address] [phone]        The results of significant diagnostics from this hospitalization (including imaging, microbiology, ancillary and laboratory) are listed below for reference.    Significant Diagnostic Studies: Ct Angio Chest Pe W/cm &/or Wo Cm  12/02/2015  CLINICAL DATA:  Shortness of breath, left-sided chest and back pain. Recent pneumonia. 10 pound weight loss, night sweats, hemoptysis. EXAM: CT ANGIOGRAPHY CHEST WITH CONTRAST TECHNIQUE: Multidetector CT imaging of the chest was performed using the standard protocol during bolus administration of intravenous contrast. Multiplanar CT image reconstructions and MIPs were obtained to evaluate the vascular anatomy. CONTRAST:  100 cc Isovue 370. COMPARISON:  None. FINDINGS: Cardiovascular: Filling defects are seen in the pulmonary arteries bilaterally, with the most proximal clot seen at the bifurcation of the left upper and left lower lobe pulmonary arteries. RV/LV ratio is 0.74. Heart size normal. Mediastinum/Nodes: No pathologically enlarged mediastinal, hilar or axillary lymph. Lungs/Pleura: There is rounded subpleural consolidation and ground-glass in the medial segment right middle lobe as well as lateral left lower lobe. No pleural fluid. Airway is unremarkable. Upper Abdomen: Visualized portions of the liver, gallbladder, adrenal glands, kidneys, spleen, pancreas, stomach and bowel are grossly unremarkable. Juxta diaphragmatic lymph node is sub cm in short axis size. Musculoskeletal: No worrisome lytic or sclerotic lesions. Review of the MIP images  confirms the above findings. IMPRESSION: 1. Bilateral pulmonary emboli. RV/LV ratio of 0.74 does not support right heart strain. Critical Value/emergent results were called by telephone at the time of interpretation on 12/02/2015 at 6:24 pm to Dr. Jacalyn LefevreJULIE HAVILAND , who verbally acknowledged these results. 2. Rounded areas of consolidation in the right middle and left lower lobes are likely due to infarcts. Pneumonia is not excluded. Electronically Signed   By: Leanna BattlesMelinda  Blietz M.D.   On: 12/02/2015 18:26    Microbiology: Recent Results (from the past 240 hour(s))  Culture, Urine     Status: None   Collection Time: 12/02/15 10:37 PM  Result Value Ref Range Status   Specimen Description URINE, CLEAN CATCH  Final   Special Requests NONE  Final   Culture NO GROWTH  Final   Report Status 12/04/2015 FINAL  Final     Labs: Basic Metabolic Panel:  Recent Labs Lab 12/02/15 1650 12/03/15 0447 12/04/15 0259  NA 137 138 139  K 4.3 4.6 3.6  CL 103 105 108  CO2 25 25 25   GLUCOSE 97 169* 105*  BUN 15 16 17   CREATININE 0.86 0.83 0.91  CALCIUM 8.9 8.8* 8.0*   Liver Function Tests: No results for input(s): AST, ALT, ALKPHOS, BILITOT, PROT, ALBUMIN in the last 168 hours. No results for input(s): LIPASE, AMYLASE in the last 168 hours. No results for input(s): AMMONIA in the last 168 hours. CBC:  Recent Labs Lab 12/02/15 1650 12/03/15 0447 12/04/15 0259  WBC 23.7* 20.1* 15.1*  NEUTROABS 21.4*  --   --   HGB 13.6 12.5 12.0  HCT 40.1 38.6 36.8  MCV 86.6 88.7 88.0  PLT 306 294 256   Cardiac Enzymes:  Recent Labs Lab 12/02/15 1955  TROPONINI <0.03   BNP: BNP (last 3 results) No results for input(s): BNP in the last 8760 hours.  ProBNP (last 3 results) No results for input(s): PROBNP in the last 8760 hours.  CBG: No results for input(s): GLUCAP in the last 168 hours.     Signed:  Jeralyn BennettZAMORA, Zalma Channing MD.  [facility] 12/04/2015, 8:58 AM

## 2015-12-07 LAB — FACTOR 5 LEIDEN

## 2015-12-08 LAB — PROTHROMBIN GENE MUTATION

## 2017-01-04 ENCOUNTER — Encounter (HOSPITAL_BASED_OUTPATIENT_CLINIC_OR_DEPARTMENT_OTHER): Payer: Self-pay | Admitting: Emergency Medicine

## 2017-01-04 ENCOUNTER — Emergency Department (HOSPITAL_BASED_OUTPATIENT_CLINIC_OR_DEPARTMENT_OTHER)
Admission: EM | Admit: 2017-01-04 | Discharge: 2017-01-05 | Disposition: A | Discharge status: Home / Self Care | Payer: Medicaid Other | Attending: Emergency Medicine | Admitting: Emergency Medicine

## 2017-01-04 DIAGNOSIS — R002 Palpitations: Secondary | ICD-10-CM | POA: Diagnosis not present

## 2017-01-04 DIAGNOSIS — Z79899 Other long term (current) drug therapy: Secondary | ICD-10-CM | POA: Insufficient documentation

## 2017-01-04 DIAGNOSIS — G4440 Drug-induced headache, not elsewhere classified, not intractable: Secondary | ICD-10-CM | POA: Insufficient documentation

## 2017-01-04 DIAGNOSIS — G4489 Other headache syndrome: Secondary | ICD-10-CM

## 2017-01-04 DIAGNOSIS — F17210 Nicotine dependence, cigarettes, uncomplicated: Secondary | ICD-10-CM | POA: Insufficient documentation

## 2017-01-04 DIAGNOSIS — T50905A Adverse effect of unspecified drugs, medicaments and biological substances, initial encounter: Secondary | ICD-10-CM

## 2017-01-04 DIAGNOSIS — R51 Headache: Secondary | ICD-10-CM | POA: Diagnosis present

## 2017-01-04 NOTE — ED Triage Notes (Signed)
Pt is 9 days post partum via c section. Pt reports feeling like heart is fluttering, headache and occasional shob. Pt has hx of PE and is on lovenox preventative.

## 2017-01-04 NOTE — ED Notes (Signed)
Pt on monitor 

## 2017-01-04 NOTE — ED Provider Notes (Addendum)
[facility] Provider Note   CSN: [id number] Arrival date & time: 01/04/17  2330  By signing my name below, I, Samantha Bennett, attest that this documentation has been prepared under the direction and in the presence of Samantha Bennett, Alenah, MD. Electronically Signed: Deland Bennett, ED Scribe. 01/04/17. 11:51 PM.  History   Chief Complaint Chief Complaint  Patient presents with  . Palpitations  . Headache   The history is provided by the patient. No language interpreter was used.  Headache   This is a new problem. The current episode started more than 2 days ago. The problem occurs constantly. The problem has not changed since onset.The headache is associated with nothing. The quality of the pain is described as dull. The pain is moderate. The pain does not radiate. Associated symptoms include syncope. Pertinent negatives include no anorexia, no fever, no chest pressure, no near-syncope, no orthopnea, no shortness of breath, no nausea and no vomiting. Associated symptoms comments: Feels palpitations with HA.  No CP no SOB no leg pain.  Is taking her lovenox faithfully.  HA started with narcotics for C-S.  No f/c/r.  No neck pain. The treatment provided no relief.    HPI Comments: Samantha Bennett is a 36 y.o. female who presents to the Emergency Department complaining of constant HA that began 5 days ago with associated persistent, intermittent palpitations. The pt recently gave birth via c-section on 01/02/2017. She states that she was on oxycodone that stopped on 01/02/2017 and took hydrocodone from then until today. She is also on preventative Lovenox. The pt states that she has a PMHx of "blood clots." She denies fever.  Past Medical History:  Diagnosis Date  . Bilateral pulmonary embolism (HCC) hospitalized 12/02/2015  . Depression   . GERD (gastroesophageal reflux disease)   . Gestational hypertension   . Obesity   . Pneumonia 10/2015    Patient Active Problem List   Diagnosis Date Noted  . Leukocytosis 12/03/2015  . Tobacco abuse 12/03/2015  . Infection of urinary tract 12/03/2015  . Pulmonary emboli (HCC) 12/02/2015  . Pulmonary embolism (HCC) 12/02/2015    Past Surgical History:  Procedure Laterality Date  . WISDOM TOOTH EXTRACTION  2016    OB History    No data available       Home Medications    Prior to Admission medications   Medication Sig Start Date End Date Taking? Authorizing Provider  enoxaparin (LOVENOX) 150 MG/ML injection Inject 150 mg into the skin daily.   Yes [provider]  HYDROcodone-acetaminophen (NORCO/VICODIN) 5-325 MG tablet Take 1 tablet by mouth every 6 (six) hours as needed for moderate pain.   Yes [provider]  ibuprofen (ADVIL,MOTRIN) 800 MG tablet Take 800 mg by mouth every 8 (eight) hours as needed.   Yes [provider]    Family History No family history on file.  Social History Social History  Substance Use Topics  . Smoking status: Current Every Day Smoker    Packs/day: 0.50    Years: 14.00    Types: Cigarettes  . Smokeless tobacco: Never Used  . Alcohol use Yes     Comment: 12/02/2015 "might have a few drinks/year; holidays, etc."     Allergies   Oxycodone   Review of Systems Review of Systems  Constitutional: Negative for diaphoresis and fever.  HENT: Negative for congestion, drooling, ear discharge, ear pain and sore throat.   Eyes: Negative for photophobia.  Respiratory: Negative for chest tightness and shortness  of breath.   Cardiovascular: Positive for syncope. Negative for chest pain, orthopnea, leg swelling and near-syncope.  Gastrointestinal: Negative for anorexia, nausea and vomiting.  Musculoskeletal: Negative for neck pain and neck stiffness.  Neurological: Positive for headaches. Negative for dizziness, tremors, seizures, syncope, facial asymmetry, speech difficulty, weakness, light-headedness and numbness.  Hematological: Does not bruise/bleed  easily.  All other systems reviewed and are negative.    Physical Exam Updated Vital Signs BP 124/68 (BP Location: Right Arm)   Pulse 68   Temp 98.1 F (36.7 C) (Oral)   Resp 20   Ht 5' 4.5" (1.638 m)   Wt (!) 330 lb (149.7 kg)   SpO2 99%   BMI 55.77 kg/m   Physical Exam  Constitutional: She is oriented to person, place, and time. She appears well-developed and well-nourished. No distress.  HENT:  Head: Normocephalic and atraumatic.  Right Ear: External ear normal.  Left Ear: External ear normal.  Mouth/Throat: Oropharynx is clear and moist. No oropharyngeal exudate.  Eyes: Pupils are equal, round, and reactive to light. Conjunctivae and EOM are normal.  No proptosis, intact cognition  Neck: Normal range of motion. No JVD present.  Cardiovascular: Normal rate, regular rhythm, normal heart sounds and intact distal pulses.  Exam reveals no gallop and no friction rub.   No murmur heard. Pulmonary/Chest: Effort normal and breath sounds normal. No stridor. No respiratory distress. She has no wheezes. She has no rales.  Abdominal: Soft. She exhibits no distension and no mass. There is no tenderness. There is no rebound and no guarding.  Musculoskeletal: Normal range of motion. She exhibits no edema, tenderness or deformity.  Lymphadenopathy:    She has no cervical adenopathy.  Neurological: She is alert and oriented to person, place, and time. She displays normal reflexes. No cranial nerve deficit.  Skin: Skin is warm and dry. Capillary refill takes less than 2 seconds.  Psychiatric: She has a normal mood and affect. Judgment normal.  Nursing note and vitals reviewed.    ED Treatments / Results   Vitals:   01/05/17 0230 01/05/17 0332  BP: 130/84 (!) 133/93  Pulse: 69 74  Resp: 16   Temp:      DIAGNOSTIC STUDIES: Oxygen Saturation is 99% on RA, normal by my interpretation.   COORDINATION OF CARE: 11:48 PM-Discussed next steps with pt. Pt verbalized understanding and  is agreeable with the plan.   Labs (all labs ordered are listed, but only abnormal results are displayed)  Results for orders placed or performed during the hospital encounter of 01/04/17  CBC with Differential/Platelet  Result Value Ref Range   WBC 9.7 4.0 - 10.5 K/uL   RBC 3.50 (L) 3.87 - 5.11 MIL/uL   Hemoglobin 9.6 (L) 12.0 - 15.0 g/dL   HCT 40.9 (L) 81.1 - 91.4 %   MCV 85.4 78.0 - 100.0 fL   MCH 27.4 26.0 - 34.0 pg   MCHC 32.1 30.0 - 36.0 g/dL   RDW 78.2 95.6 - 21.3 %   Platelets 314 150 - 400 K/uL   Neutrophils Relative % 69 %   Neutro Abs 6.7 1.7 - 7.7 K/uL   Lymphocytes Relative 23 %   Lymphs Abs 2.2 0.7 - 4.0 K/uL   Monocytes Relative 5 %   Monocytes Absolute 0.5 0.1 - 1.0 K/uL   Eosinophils Relative 3 %   Eosinophils Absolute 0.3 0.0 - 0.7 K/uL   Basophils Relative 0 %   Basophils Absolute 0.0 0.0 - 0.1 K/uL  Basic metabolic panel  Result Value Ref Range   Sodium 139 135 - 145 mmol/L   Potassium 3.8 3.5 - 5.1 mmol/L   Chloride 104 101 - 111 mmol/L   CO2 25 22 - 32 mmol/L   Glucose, Bld 104 (H) 65 - 99 mg/dL   BUN 16 6 - 20 mg/dL   Creatinine, Ser 1.610.92 0.44 - 1.00 mg/dL   Calcium 9.0 8.9 - 09.610.3 mg/dL   GFR calc non Af Amer >60 >60 mL/min   GFR calc Af Amer >60 >60 mL/min   Anion gap 10 5 - 15  Troponin I  Result Value Ref Range   Troponin I <0.03 <0.03 ng/mL   Dg Chest 2 View  Result Date: 01/05/2017 CLINICAL DATA:  Headache behind the eyes.  Heart palpitations. EXAM: CHEST  2 VIEW COMPARISON:  Chest CT 12/05/2015 FINDINGS: The heart size and mediastinal contours are within normal limits. Both lungs are clear. The visualized skeletal structures are unremarkable. IMPRESSION: No active cardiopulmonary disease. Electronically Signed   By: Tollie Ethavid  Kwon M.D.   On: 01/05/2017 02:08   Ct Head Wo Contrast  Result Date: 01/05/2017 CLINICAL DATA:  Headache behind the eyes. EXAM: CT HEAD WITHOUT CONTRAST TECHNIQUE: Contiguous axial images were obtained from the base of  the skull through the vertex without intravenous contrast. COMPARISON:  None. FINDINGS: Brain: No evidence of acute infarction, hemorrhage, hydrocephalus, extra-axial collection or mass lesion/mass effect. Vascular: No hyperdense vessel or unexpected calcification. Skull: Normal. Negative for fracture or focal lesion. Sinuses/Orbits: No acute finding. Other: None IMPRESSION: No acute intracranial abnormality. Electronically Signed   By: Tollie Ethavid  Kwon M.D.   On: 01/05/2017 01:54    EKG   EKG Interpretation  Date/Time:  Wednesday January 04 2017 23:51:53 EDT Ventricular Rate:  75 PR Interval:    QRS Duration: 92 QT Interval:  376 QTC Calculation: 420 R Axis:   59 Text Interpretation:  Sinus rhythm Confirmed by Nicanor AlconPalumbo, Ladon ([id number]) on 01/05/2017 1:27:36 AM        Procedures Procedures (including critical care time)  Medications Ordered in ED Medications  ketorolac (TORADOL) 30 MG/ML injection 15 mg (15 mg Intravenous Refused 01/05/17 0333)       Final Clinical Impressions(s) / ED Diagnoses  Analgesia rebound headache/ medication effect from narcotics:  Stop opioids and use tylenol alternating with ibuprofen. I see no indication for imaging of the chest as the patient is already on the definite therapy which is lovenox and is not tachycardiac no tachypneic.  Moreover the EKG is normal as is the CXR and she is saturating 99-100 on room air.  Follow up with your family physician  She is very well appearing and has been observed in the ED.  Strict return precautions given for facial swelling, fevers, drooling, swelling of the mouth or throat, vomiting, weakness, inability to tolerate oral liquids or foods, shortness of breath, changes in vision or thinking, weakness or numbness or any concerns. No signs of systemic illness or infection. The patient is nontoxic-appearing on exam and vital signs are within normal limits.     I have reviewed the triage vital signs and the nursing notes.  Pertinent labs &imaging results that were available during my care of the patient were reviewed by me and considered in my medical decision making (see chart for details).  After history, exam, and medical workup I feel the patient has been appropriately medically screened and is safe for discharge home. Pertinent diagnoses were discussed with the patient.  Patient was given return precautions.  I personally performed the services described in this documentation, which was scribed in my presence. The recorded information has been reviewed and is accurate.      Mida Cory, Cathalina, MD 01/05/17 1610    Cy Blamer, MD 01/05/17 [id number]

## 2017-01-05 ENCOUNTER — Emergency Department (HOSPITAL_BASED_OUTPATIENT_CLINIC_OR_DEPARTMENT_OTHER): Payer: Medicaid Other

## 2017-01-05 LAB — CBC WITH DIFFERENTIAL/PLATELET
BASOS ABS: 0 10*3/uL (ref 0.0–0.1)
BASOS PCT: 0 %
EOS PCT: 3 %
Eosinophils Absolute: 0.3 10*3/uL (ref 0.0–0.7)
HCT: 29.9 % — ABNORMAL LOW (ref 36.0–46.0)
Hemoglobin: 9.6 g/dL — ABNORMAL LOW (ref 12.0–15.0)
Lymphocytes Relative: 23 %
Lymphs Abs: 2.2 10*3/uL (ref 0.7–4.0)
MCH: 27.4 pg (ref 26.0–34.0)
MCHC: 32.1 g/dL (ref 30.0–36.0)
MCV: 85.4 fL (ref 78.0–100.0)
MONO ABS: 0.5 10*3/uL (ref 0.1–1.0)
Monocytes Relative: 5 %
Neutro Abs: 6.7 10*3/uL (ref 1.7–7.7)
Neutrophils Relative %: 69 %
PLATELETS: 314 10*3/uL (ref 150–400)
RBC: 3.5 MIL/uL — ABNORMAL LOW (ref 3.87–5.11)
RDW: 15.1 % (ref 11.5–15.5)
WBC: 9.7 10*3/uL (ref 4.0–10.5)

## 2017-01-05 LAB — BASIC METABOLIC PANEL
ANION GAP: 10 (ref 5–15)
BUN: 16 mg/dL (ref 6–20)
CALCIUM: 9 mg/dL (ref 8.9–10.3)
CO2: 25 mmol/L (ref 22–32)
Chloride: 104 mmol/L (ref 101–111)
Creatinine, Ser: 0.92 mg/dL (ref 0.44–1.00)
Glucose, Bld: 104 mg/dL — ABNORMAL HIGH (ref 65–99)
Potassium: 3.8 mmol/L (ref 3.5–5.1)
SODIUM: 139 mmol/L (ref 135–145)

## 2017-01-05 LAB — TROPONIN I

## 2017-01-05 MED ORDER — KETOROLAC TROMETHAMINE 30 MG/ML IJ SOLN: 15.0000 mg | Freq: Once | INTRAMUSCULAR | Status: DC

## 2017-06-11 IMAGING — CT CT ANGIO CHEST
2 of 9 series · 18 of 36 positions shown · IV contrast (isovue)
Comparison: None.

CLINICAL DATA: Shortness of breath, left-sided chest and back pain.
Recent pneumonia. 10 pound weight loss, night sweats, hemoptysis.

EXAM:
CT ANGIOGRAPHY CHEST WITH CONTRAST
TECHNIQUE: Multidetector CT imaging of the chest was performed using the
standard protocol during bolus administration of intravenous
contrast. Multiplanar CT image reconstructions and MIPs were
obtained to evaluate the vascular anatomy.
CONTRAST:  100 cc Isovue 370.

[Series 7: pe thins · axial · 0.83mm/px · z∈[-314,-62]mm · 17 of 282 slices shown]
[im 15/282  lung]
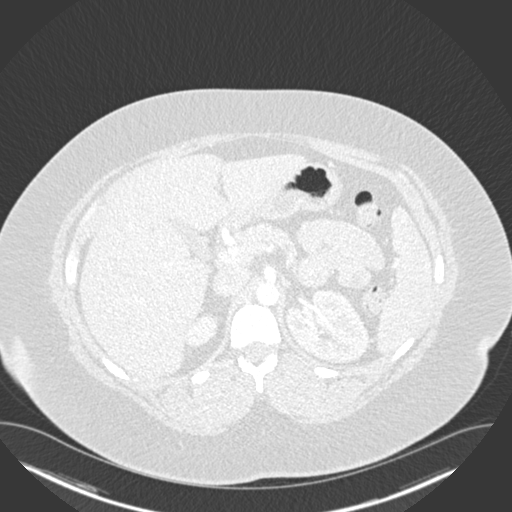
[im 30/282  mediastinal]
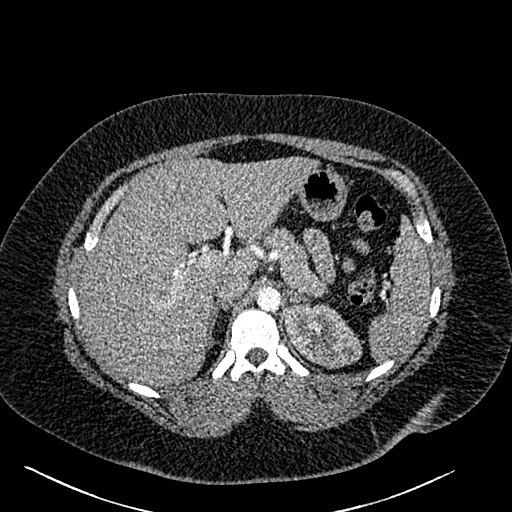
[im 45/282  lung]
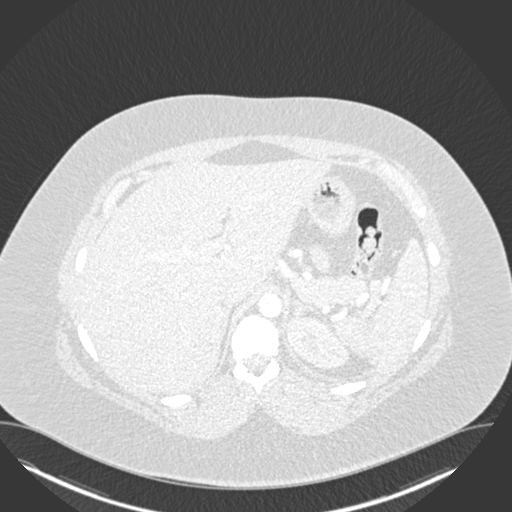
[im 60/282  mediastinal]
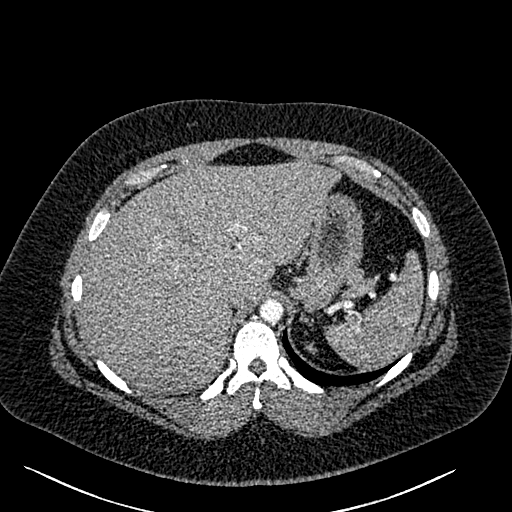
[im 74/282  lung]
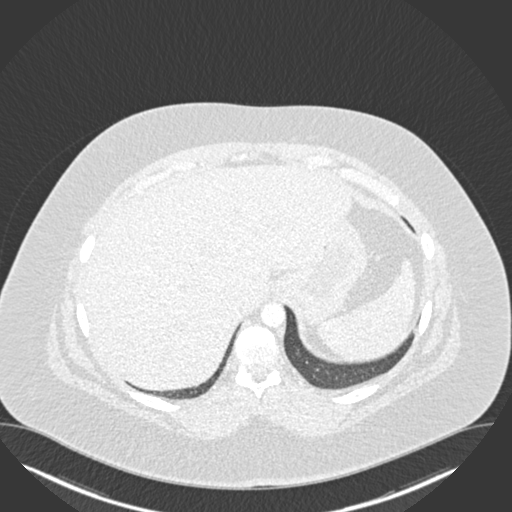
[im 89/282  mediastinal]
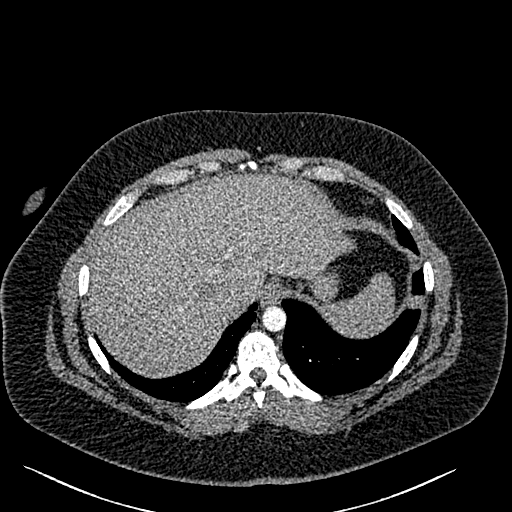
[im 104/282  lung]
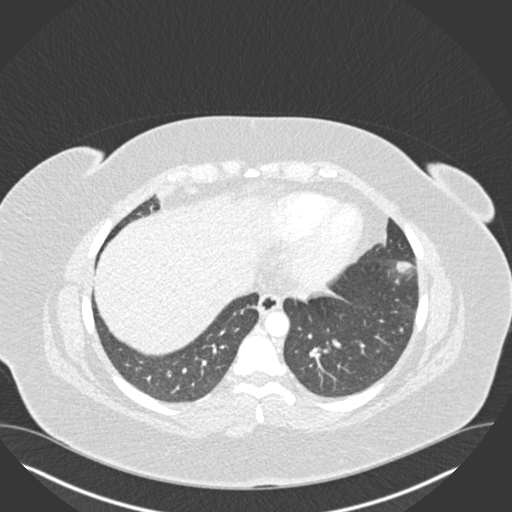
[im 119/282  mediastinal]
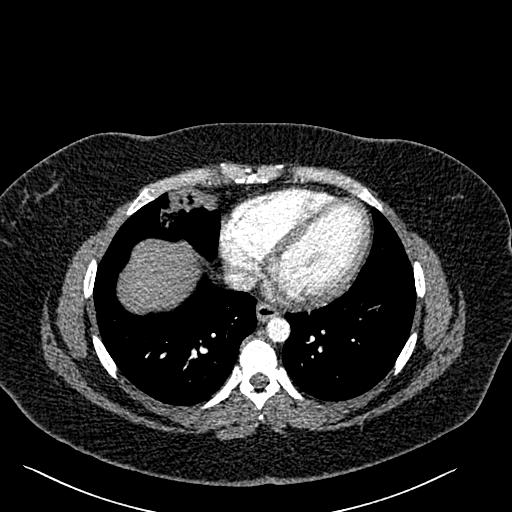
[im 148/282  lung]
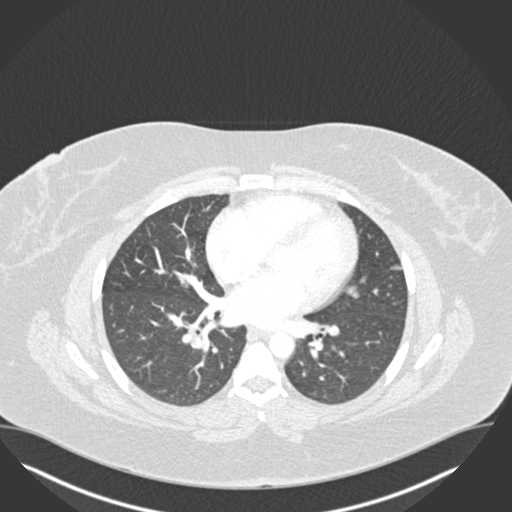
[im 163/282  mediastinal]
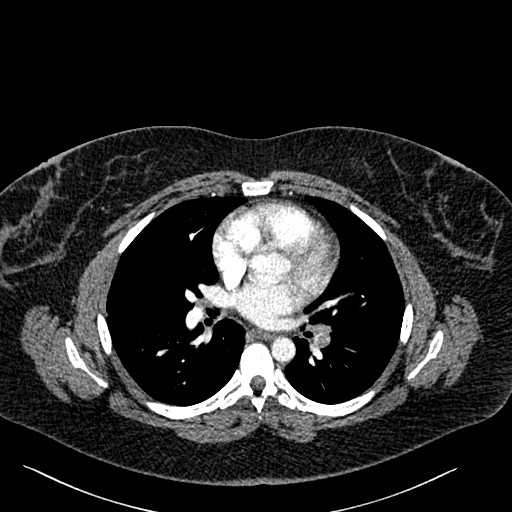
[im 178/282  lung]
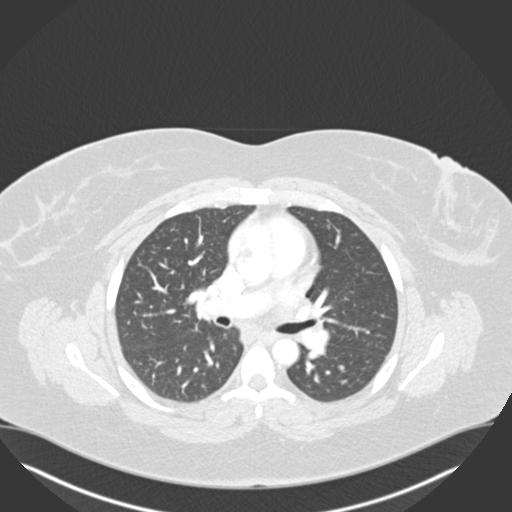
[im 193/282  mediastinal]
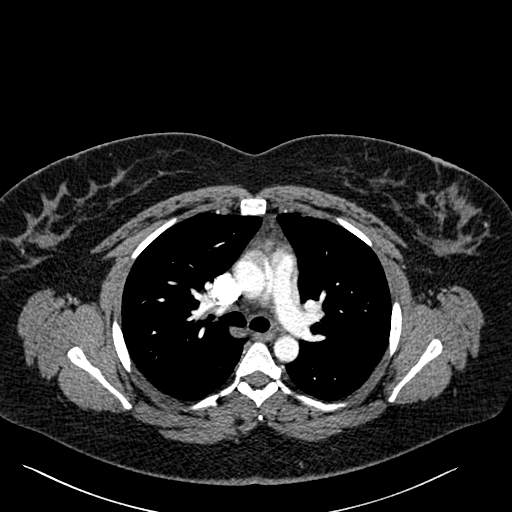
[im 208/282  lung]
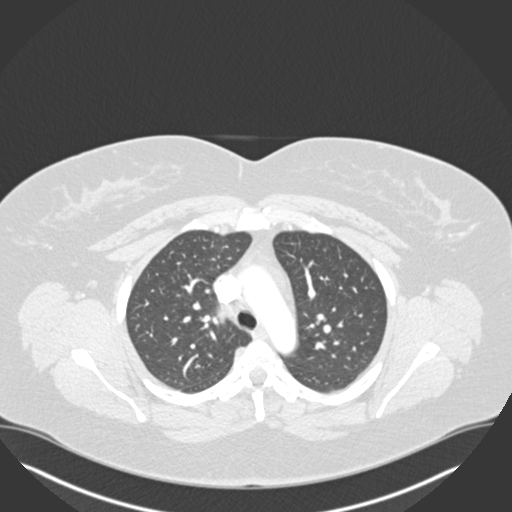
[im 222/282  mediastinal]
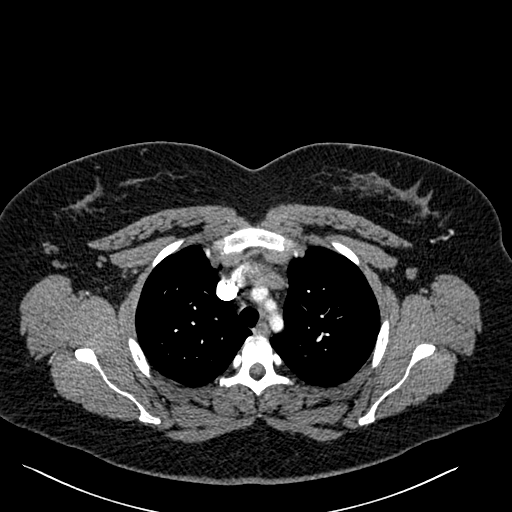
[im 237/282  lung]
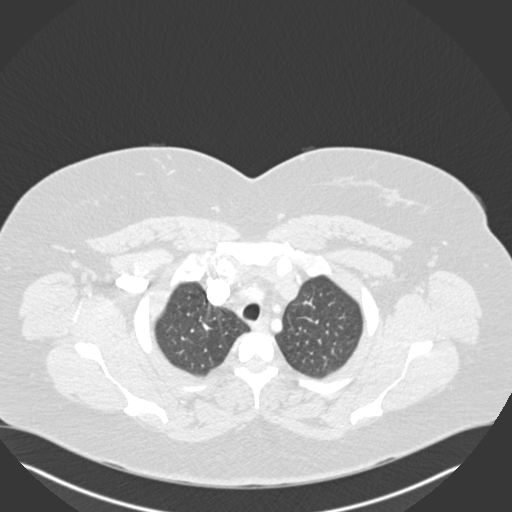
[im 252/282  mediastinal]
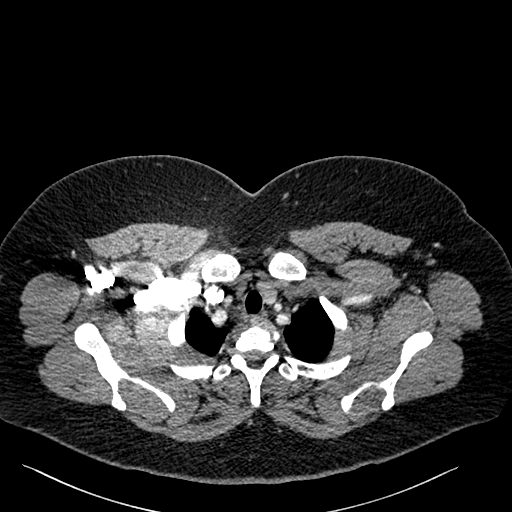
[im 267/282  lung]
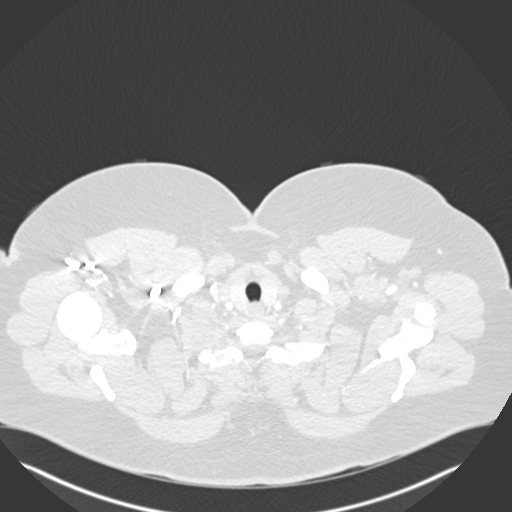

[Series 8: pe coronal mpr · coronal · 0.60mm/px · 1 of 129 slices shown]
[im 65/129  mediastinal]
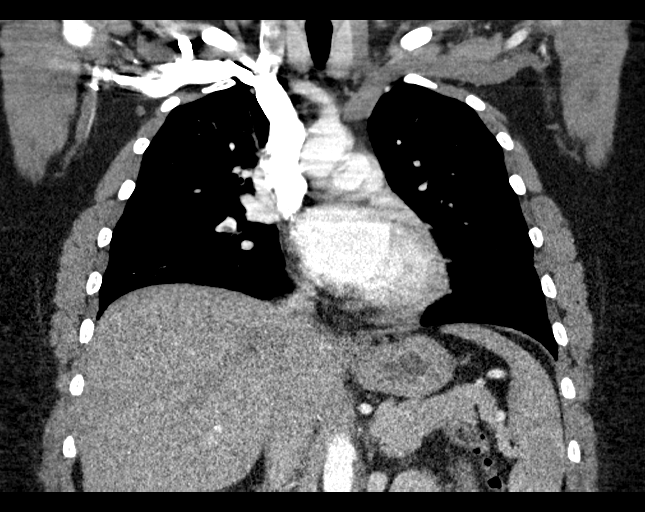

[18 of 36 positions shown; findings below may reference images not displayed]

FINDINGS: Cardiovascular: Filling defects are seen in the pulmonary arteries
bilaterally, with the most proximal clot seen at the bifurcation of
the left upper and left lower lobe pulmonary arteries. RV/LV ratio
is 0.74. Heart size normal.

Mediastinum/Nodes: No pathologically enlarged mediastinal, hilar or
axillary lymph.

Lungs/Pleura: There is rounded subpleural consolidation and
ground-glass in the medial segment right middle lobe as well as
lateral left lower lobe. No pleural fluid. Airway is unremarkable.

Upper Abdomen: Visualized portions of the liver, gallbladder,
adrenal glands, kidneys, spleen, pancreas, stomach and bowel are
grossly unremarkable. Juxta diaphragmatic lymph node is sub cm in
short axis size.

Musculoskeletal: No worrisome lytic or sclerotic lesions.

Review of the MIP images confirms the above findings.
IMPRESSION: 1. Bilateral pulmonary emboli. RV/LV ratio of 0.74 does not support
right heart strain. Critical Value/emergent results were called by
telephone at the time of interpretation on 12/02/2015 at [DATE] to
Dr. OLINDA NIK , who verbally acknowledged these results.
2. Rounded areas of consolidation in the right middle and left lower
lobes are likely due to infarcts. Pneumonia is not excluded.

## 2022-01-16 ENCOUNTER — Emergency Department (HOSPITAL_BASED_OUTPATIENT_CLINIC_OR_DEPARTMENT_OTHER)
Admission: EM | Admit: 2022-01-16 | Discharge: 2022-01-17 | Disposition: A | Discharge status: Home / Self Care | Payer: Self-pay | Attending: Emergency Medicine | Admitting: Emergency Medicine

## 2022-01-16 ENCOUNTER — Encounter (HOSPITAL_BASED_OUTPATIENT_CLINIC_OR_DEPARTMENT_OTHER): Payer: Self-pay | Admitting: Emergency Medicine

## 2022-01-16 DIAGNOSIS — S61215A Laceration without foreign body of left ring finger without damage to nail, initial encounter: Secondary | ICD-10-CM | POA: Insufficient documentation

## 2022-01-16 DIAGNOSIS — W25XXXA Contact with sharp glass, initial encounter: Secondary | ICD-10-CM | POA: Insufficient documentation

## 2022-01-16 MED ORDER — LIDOCAINE HCL (PF) 1 % IJ SOLN
10.0000 mL | Freq: Once | INTRAMUSCULAR | Status: DC
  Filled 2022-01-16: qty 10

## 2022-01-16 NOTE — ED Triage Notes (Signed)
Pt here with a lac to the ring finger to the left hand ,

## 2022-01-16 NOTE — ED Provider Notes (Signed)
[facility] EMERGENCY DEPARTMENT Provider Note   CSN: [id number] Arrival date & time: 01/16/22  2308     History  No chief complaint on file.  Samantha Bennett is a 41 y.o. female who presents to the Emergency Department complaining of left ring finger laceration onset PTA. She cut her left ring finger on a glass bowl while cleaning it. Pt is UTD with her tetanus immunization. No meds tried PTA. Denies color change, swelling, fever, chills. Denies anticoagulant use.    The history is provided by the patient. No language interpreter was used.       Home Medications Prior to Admission medications   Medication Sig Start Date End Date Taking? Authorizing Provider  enoxaparin (LOVENOX) 150 MG/ML injection Inject 150 mg into the skin daily.    [provider]  HYDROcodone-acetaminophen (NORCO/VICODIN) 5-325 MG tablet Take 1 tablet by mouth every 6 (six) hours as needed for moderate pain.    [provider]  ibuprofen (ADVIL,MOTRIN) 800 MG tablet Take 800 mg by mouth every 8 (eight) hours as needed.    [provider]      Allergies    Oxycodone    Review of Systems   Review of Systems  Constitutional:  Negative for chills and fever.  Musculoskeletal:  Positive for arthralgias. Negative for joint swelling.  Skin:  Positive for wound. Negative for color change.  Neurological:  Negative for weakness and numbness.  All other systems reviewed and are negative.   Physical Exam Updated Vital Signs BP (!) 154/96 (BP Location: Right Wrist)   Pulse 100   Resp 20   SpO2 100%  Physical Exam Vitals and nursing note reviewed.  Constitutional:      General: She is not in acute distress.    Appearance: Normal appearance. She is not ill-appearing.  HENT:     Head: Normocephalic and atraumatic.     Right Ear: External ear normal.     Left Ear: External ear normal.  Eyes:     General: No scleral icterus. Cardiovascular:     Rate and Rhythm: Normal  rate.  Pulmonary:     Effort: Pulmonary effort is normal.  Musculoskeletal:        General: Normal range of motion.     Cervical back: Normal range of motion and neck supple.  Skin:    General: Skin is warm and dry.     Capillary Refill: Capillary refill takes less than 2 seconds.     Findings: Laceration present.     Comments: 0.5 cm partial avulsion of the finger pad of left fourth finger.  Neurological:     Mental Status: She is alert.     ED Results / Procedures / Treatments   Labs (all labs ordered are listed, but only abnormal results are displayed) Labs Reviewed - No data to display  EKG None  Radiology No results found.  Procedures .Marland KitchenLaceration Repair  Date/Time: 01/17/2022 12:21 AM  Performed by: Karenann Cai, PA-C Authorized by: Karenann Cai, PA-C   Consent:    Consent obtained:  Verbal   Consent given by:  Patient   Risks discussed:  Infection, pain and need for additional repair Universal protocol:    Patient identity confirmed:  Verbally with patient and hospital-assigned identification number Anesthesia:    Anesthesia method:  Local infiltration   Local anesthetic:  Lidocaine 1% w/o epi (5 ml) Laceration details:    Location:  Finger   Finger location:  L  ring finger   Length (cm):  0.5 Pre-procedure details:    Preparation:  Patient was prepped and draped in usual sterile fashion Exploration:    Hemostasis achieved with:  Direct pressure   Imaging outcome: foreign body not noted     Wound exploration: entire depth of wound visualized   Treatment:    Area cleansed with:  Saline   Amount of cleaning:  Standard   Irrigation solution:  Sterile saline   Irrigation method:  Syringe Skin repair:    Repair method: quick clot for hemostasis. Approximation:    Approximation:  Close Repair type:    Repair type:  Simple Post-procedure details:    Dressing:  Bulky dressing   Procedure completion:  Tolerated well, no immediate complications      Medications Ordered in ED Medications  lidocaine (PF) (XYLOCAINE) 1 % injection 10 mL (has no administration in time range)  silver nitrate applicators 75-25 % applicator (has no administration in time range)    ED Course/ Medical Decision Making/ A&P                           Medical Decision Making Risk Prescription drug management.   Patient presents with laceration noted PTA. Pt is not on anticoagulants at this time. Vital signs, Pt afebrile. On exam, patient with 0.5 cm laceration noted to distal tip of left ring finger. Tetanus up-to-date. Laceration occurred < 12 hours prior to repair. Differential diagnosis includes, fracture, foreign body, dislocation, avulsion.   Disposition: Presentation suspicious for partial avulsion of finger pad of left fourth finger.. Doubt fracture, dislocation, or foreign body at this time. Tetanus updated in the ED. Wound thoroughly irrigated, no foreign bodies noted. Laceration repaired in the ED today with quick clot and Kerlix. After consideration of the diagnostic results and the patients response to treatment, I feel that the patient would benefit from Discharge home.  Patient to return for wound check sooner should there be signs of dehiscence or infection. Pt is hemodynamically stable with no complaints prior to discharge. Supportive care measures and strict return precautions discussed with patient at bedside. Pt acknowledges and verbalizes understanding. Pt appears safe for discharge. Follow up as indicated in discharge paperwork.    This chart was dictated using voice recognition software, Dragon. Despite the best efforts of this provider to proofread and correct errors, errors may still occur which can change documentation meaning.   Final Clinical Impression(s) / ED Diagnoses Final diagnoses:  Laceration of left ring finger without foreign body without damage to nail, initial encounter    Rx / DC Orders ED Discharge Orders     None          Mareon Robinette A, PA-C 01/17/22 0025    Pollyann Savoy, MD 01/17/22 [phone]

## 2022-01-16 NOTE — Discharge Instructions (Addendum)
It was a pleasure taking care of you today!   You may return to urgent care or return to the emergency department for suture removal in 7-10 days.  Keep the area clean and dry.  Return to the emergency department if worsening or persistent pain, drainage of wound, increased swelling, or color change to area.  

## 2022-01-17 MED ORDER — SILVER NITRATE-POT NITRATE 75-25 % EX MISC
CUTANEOUS | Status: AC
  Filled 2022-01-17: qty 10
